# Patient Record
Sex: Male | Born: 1965
Health system: Southern US, Community
[De-identification: ages and names within clinical notes are randomized; demographics above are authoritative.]

---

## 2004-03-25 ENCOUNTER — Encounter (INDEPENDENT_AMBULATORY_CARE_PROVIDER_SITE_OTHER): Payer: Self-pay | Admitting: *Deleted

## 2004-03-25 ENCOUNTER — Ambulatory Visit (HOSPITAL_COMMUNITY): Admission: RE | Admit: 2004-03-25 | Discharge: 2004-03-25 | Payer: Self-pay | Admitting: General Surgery

## 2004-08-27 ENCOUNTER — Emergency Department (HOSPITAL_COMMUNITY): Admission: EM | Admit: 2004-08-27 | Discharge: 2004-08-27 | Payer: Self-pay | Admitting: Emergency Medicine

## 2004-09-17 ENCOUNTER — Emergency Department (HOSPITAL_COMMUNITY): Admission: EM | Admit: 2004-09-17 | Discharge: 2004-09-17 | Payer: Self-pay | Admitting: Emergency Medicine

## 2004-11-08 ENCOUNTER — Ambulatory Visit: Payer: Self-pay | Admitting: Internal Medicine

## 2009-11-23 ENCOUNTER — Ambulatory Visit (HOSPITAL_COMMUNITY): Admission: RE | Admit: 2009-11-23 | Discharge: 2009-11-23 | Payer: Self-pay | Admitting: Family Medicine

## 2011-05-12 NOTE — Op Note (Signed)
NAME:  Joshua Pearson, Joshua Pearson                           ACCOUNT NO.:  [id number]   MEDICAL RECORD NO.:  [id number]                   PATIENT TYPE:  AMB   LOCATION:  DAY                                  FACILITY:  [facility]   PHYSICIAN:  Timothy E. Earlene Plater, M.D.              DATE OF BIRTH:  Jul 07, 1966   DATE OF PROCEDURE:  03/26/2003  DATE OF DISCHARGE:                                 OPERATIVE REPORT   PREOPERATIVE DIAGNOSIS:  Persistent fistula-in-ano.   POSTOPERATIVE DIAGNOSIS:  Persistent fistula-in-ano.   PROCEDURE:  Second-stage fistulotomy.   SURGEON:  Kendrick Ranch, M.D.   ANESTHESIA:  General.   INDICATIONS FOR PROCEDURE:  Mr. Vanetten is 73, recently moved to [place]  from [place].  He had two operations in [place] that are thought to be an  incision and drainage of perirectal abscess, followed by fistulotomy.  I  have followed the patient postoperatively for the past 4 months, and he has  developed what appears to be a recurrent abscess versus fistula with a  nonhealing posterior wound.  He is ready to proceed with reexamination under  anesthesia and fistulotomy as indicated.  This has been carefully discussed  including the precise details of the anatomy of the anorectum including the  sphincter.   The patient was seen in the preop area, evaluated by anesthesia, identified  and the permit signed.   He was taken to the operating room, placed supine, LMA anesthesia provided.  He was placed in lithotomy.  Perianal area inspected, prepped, and draped in  the usual fashion.  The anal orifice and external skin was completely  normal.  The rectal vault was prepped with Betadine and then under vision  with the anoscope in place, the posterior aspect of the anoderm had a 1.5 cm  roughly circular area of granulation tissue.  A malleable probe was dropped  into this area and  quickly found its depth to be approximately 2 cm,  pointing towards the posterior perianal skin.  So, I debrided the  granulation tissue from the cavity, cauterized the edges, cauterized the  bleeding areas.  I opened the distal perianal skin for a distance of 1 cm to  create what hopefully will be external drainage but without interfering with  the sphincters.  I debrided the cavity with bone scoops until clean and  clear.  The edges were cauterized.  Bleeding was controlled, and a pack of  Gelfoam gauze was placed in the cavity.  This completed the procedure.  I  believe the sphincters are intact.  He tolerated it well, counts correct.  He was taken to the recovery room in good condition.   Written and verbal instructions were given him and his wife including Cipro  #42, 1 b.i.d. 500 mg and #30 Percocet 5 mg, and he will be seen and followed  in the office.  Timothy E. Earlene Plater, M.D.    TED/MEDQ  D:  03/25/2004  T:  03/25/2004  Job:  [id number]

## 2011-09-26 ENCOUNTER — Other Ambulatory Visit: Payer: Self-pay | Admitting: Dermatology

## 2012-01-30 ENCOUNTER — Other Ambulatory Visit: Payer: Self-pay | Admitting: Otolaryngology

## 2012-01-30 DIAGNOSIS — H7092 Unspecified mastoiditis, left ear: Secondary | ICD-10-CM

## 2012-02-02 ENCOUNTER — Ambulatory Visit
Admission: RE | Admit: 2012-02-02 | Discharge: 2012-02-02 | Disposition: A | Discharge status: Home / Self Care | Payer: Federal, State, Local not specified - PPO | Source: Ambulatory Visit | Attending: Otolaryngology | Admitting: Otolaryngology

## 2012-02-02 DIAGNOSIS — H7092 Unspecified mastoiditis, left ear: Secondary | ICD-10-CM

## 2012-02-02 MED ORDER — IOHEXOL 300 MG/ML  SOLN
75.0000 mL | Freq: Once | INTRAMUSCULAR | Status: AC | PRN
  Administered 2012-02-02: 75 mL via INTRAVENOUS

## 2015-09-15 ENCOUNTER — Ambulatory Visit (INDEPENDENT_AMBULATORY_CARE_PROVIDER_SITE_OTHER): Payer: Federal, State, Local not specified - PPO | Admitting: Licensed Clinical Social Worker

## 2015-09-15 DIAGNOSIS — F4323 Adjustment disorder with mixed anxiety and depressed mood: Secondary | ICD-10-CM | POA: Diagnosis not present

## 2015-09-30 ENCOUNTER — Ambulatory Visit (INDEPENDENT_AMBULATORY_CARE_PROVIDER_SITE_OTHER): Payer: Federal, State, Local not specified - PPO | Admitting: Licensed Clinical Social Worker

## 2015-09-30 DIAGNOSIS — F4323 Adjustment disorder with mixed anxiety and depressed mood: Secondary | ICD-10-CM | POA: Diagnosis not present

## 2016-04-06 DIAGNOSIS — F4323 Adjustment disorder with mixed anxiety and depressed mood: Secondary | ICD-10-CM | POA: Diagnosis not present

## 2016-04-13 DIAGNOSIS — K080 Exfoliation of teeth due to systemic causes: Secondary | ICD-10-CM | POA: Diagnosis not present

## 2016-04-18 DIAGNOSIS — G4733 Obstructive sleep apnea (adult) (pediatric): Secondary | ICD-10-CM | POA: Diagnosis not present

## 2016-05-12 DIAGNOSIS — Z125 Encounter for screening for malignant neoplasm of prostate: Secondary | ICD-10-CM | POA: Diagnosis not present

## 2016-05-12 DIAGNOSIS — E291 Testicular hypofunction: Secondary | ICD-10-CM | POA: Diagnosis not present

## 2016-06-08 DIAGNOSIS — H01002 Unspecified blepharitis right lower eyelid: Secondary | ICD-10-CM | POA: Diagnosis not present

## 2016-06-08 DIAGNOSIS — H01004 Unspecified blepharitis left upper eyelid: Secondary | ICD-10-CM | POA: Diagnosis not present

## 2016-06-08 DIAGNOSIS — H01001 Unspecified blepharitis right upper eyelid: Secondary | ICD-10-CM | POA: Diagnosis not present

## 2016-07-05 DIAGNOSIS — E039 Hypothyroidism, unspecified: Secondary | ICD-10-CM | POA: Diagnosis not present

## 2016-07-05 DIAGNOSIS — R7309 Other abnormal glucose: Secondary | ICD-10-CM | POA: Diagnosis not present

## 2016-07-05 DIAGNOSIS — G4733 Obstructive sleep apnea (adult) (pediatric): Secondary | ICD-10-CM | POA: Diagnosis not present

## 2016-07-05 DIAGNOSIS — E291 Testicular hypofunction: Secondary | ICD-10-CM | POA: Diagnosis not present

## 2016-07-11 DIAGNOSIS — M25511 Pain in right shoulder: Secondary | ICD-10-CM | POA: Diagnosis not present

## 2016-07-20 DIAGNOSIS — G4733 Obstructive sleep apnea (adult) (pediatric): Secondary | ICD-10-CM | POA: Diagnosis not present

## 2016-07-21 DIAGNOSIS — F4323 Adjustment disorder with mixed anxiety and depressed mood: Secondary | ICD-10-CM | POA: Diagnosis not present

## 2016-07-21 DIAGNOSIS — M4722 Other spondylosis with radiculopathy, cervical region: Secondary | ICD-10-CM | POA: Diagnosis not present

## 2016-07-27 DIAGNOSIS — M546 Pain in thoracic spine: Secondary | ICD-10-CM | POA: Diagnosis not present

## 2016-07-27 DIAGNOSIS — M542 Cervicalgia: Secondary | ICD-10-CM | POA: Diagnosis not present

## 2016-07-27 DIAGNOSIS — M62838 Other muscle spasm: Secondary | ICD-10-CM | POA: Diagnosis not present

## 2016-07-28 DIAGNOSIS — M546 Pain in thoracic spine: Secondary | ICD-10-CM | POA: Diagnosis not present

## 2016-07-28 DIAGNOSIS — M62838 Other muscle spasm: Secondary | ICD-10-CM | POA: Diagnosis not present

## 2016-07-28 DIAGNOSIS — M542 Cervicalgia: Secondary | ICD-10-CM | POA: Diagnosis not present

## 2016-07-30 DIAGNOSIS — M25511 Pain in right shoulder: Secondary | ICD-10-CM | POA: Diagnosis not present

## 2016-07-30 DIAGNOSIS — M549 Dorsalgia, unspecified: Secondary | ICD-10-CM | POA: Diagnosis not present

## 2016-08-09 ENCOUNTER — Encounter: Payer: Self-pay | Admitting: Sports Medicine

## 2016-08-09 ENCOUNTER — Ambulatory Visit (INDEPENDENT_AMBULATORY_CARE_PROVIDER_SITE_OTHER): Payer: Federal, State, Local not specified - PPO | Admitting: Sports Medicine

## 2016-08-09 VITALS — BP 153/112 | Ht 73.0 in | Wt 290.0 lb

## 2016-08-09 DIAGNOSIS — M898X1 Other specified disorders of bone, shoulder: Secondary | ICD-10-CM

## 2016-08-09 DIAGNOSIS — R29898 Other symptoms and signs involving the musculoskeletal system: Secondary | ICD-10-CM

## 2016-08-09 DIAGNOSIS — M542 Cervicalgia: Secondary | ICD-10-CM | POA: Diagnosis not present

## 2016-08-09 MED ORDER — GABAPENTIN 300 MG PO CAPS: ORAL_CAPSULE | ORAL | 1 refills | Status: DC

## 2016-08-09 NOTE — Patient Instructions (Signed)
Thank you for coming in,   We will call with the results of the MRI once it is performed.   Please take gabapentin 300 mg at night for the first week.   If it does not make you too tired, then you can take 300 mg three times a day.     Please feel free to call with any questions or concerns at any time, at [phone]. --Dr. Raeford Razor

## 2016-08-09 NOTE — Progress Notes (Signed)
URIAS CAIN - 50 y.o. male MRN [id number]  Date of birth: 06-11-1966  SUBJECTIVE:  Including CC & ROS.  Chief Complaint  Patient presents with  . Shoulder Pain    Mr. Samra is a 50 year old male that is presenting with right scapular pain and numbness in his posterior right arm. He reports his scapular pain has been there for 20 years and acute on chronic. It first started after he was serving in tenderness. He has since quit playing tennis and has done more weight lifting. He has seen several doctors since his current flare initiated. He went to his primary care doctor and was placed on a muscle relaxer hydrocodone. There is rather then referred to Dr. Tamera Punt who did x-rays of his neck and told him that he had arthritis of his neck. He was placed on prednisone at that time. He also was referred to physical therapy and participated in 2 sessions and reports that the pain became worse. He is seen by an urgent care and placed on tramadol and that seems to improve some of his pain. His also received an IM Depo-Medrol injection but helped some of his pain is well. He reports the pain is sharp. He reports that the numbness and tingling occurs in his C6-C7 dermatome and stops at his distal forearm.  ROS: No unexpected weight loss, fever, chills, swelling, instability, redness, itching, seizure, unsteady gait otherwise see HPI    HISTORY: Past Medical, Surgical, Social, and Family History Reviewed & Updated per EMR.   Pertinent Historical Findings include: PMSHx -  AVM, hypothyroidism, GERD, Brain surgery, anal fistula  PSHx -  No tobacco or alcohol  FHx -  No arthritis that runs in the family, works as a Music therapist   Medications - tramadol, ibuprofen, tylenol, keppra   DATA REVIEWED: X-rays of neck taken at orthopaedic office   PHYSICAL EXAM:  VS: BP:(!) 153/112  HR: bpm  TEMP: ( )  RESP:   HT:6\' 1"  (185.4 cm)   WT:290 lb (131.5 kg)  BMI:38.3 PHYSICAL EXAM: Gen: appears in to be  in pain, alert, cooperative with exam,  HEENT: clear conjunctiva, EOMI CV:  no edema, capillary refill brisk,  Resp: non-labored, normal speech Skin: no rashes, normal turgor  Neuro: normal sensation in hands and fingers.   Psych:  alert and oriented Neck: Inspection unremarkable. No palpable stepoffs. Negative Spurling's maneuver. Limited range of motion in neck flexion to about 5 and reproduction of pain. Normal left rotation and rotation to the right with reproduction of pain.  Limitation with right lateral rotation Normal pincer grasp on left and weak on right  Weakness with resistance to wrist extension on right compared to left  Shoulder: Holding his right shoulder at a more elevated position than left.  Normal scapular function  Palpation is normal with no tenderness over AC joint or bicipital groove. ROM is full in all planes. Weakness with the empty can test  No signs of impingement with negative Hawkin's tests No painful arc and no drop arm sign.   ASSESSMENT & PLAN:   Weakness of right upper extremity The pain, numbness and weakness on exam are most likely originating from his neck. His shoulder exam did not suggest a rotator cuff origin of his pain. - MRI of his cervical neck. - Started gabapentin 300 daily at bedtime and then it advanced to 3 times a day. Counseled on its use. - We will call with the results from the MRI. Depending on the  results we may need to refer to neurosurgery.  Periscapular pain The pain could be related to the innervation of the long thoracic that is C5-C7 that is causing his pain from a pinched nerve in his neck. He has normal scapular function. - MRI cervical neck and gabapentin  CC: Dr. Shirline Frees

## 2016-08-09 NOTE — Assessment & Plan Note (Addendum)
The pain could be related to the innervation of the long thoracic that is C5-C7 that is causing his pain from a pinched nerve in his neck. He has normal scapular function. - MRI cervical neck and gabapentin

## 2016-08-09 NOTE — Assessment & Plan Note (Addendum)
The pain, numbness and weakness on exam are most likely originating from his neck. His shoulder exam did not suggest a rotator cuff origin of his pain. - MRI of his cervical neck. - Started gabapentin 300 daily at bedtime and then it advanced to 3 times a day. Counseled on its use. - We will call with the results from the MRI. Depending on the results we may need to refer to neurosurgery.

## 2016-08-10 ENCOUNTER — Other Ambulatory Visit: Payer: Self-pay | Admitting: *Deleted

## 2016-08-10 ENCOUNTER — Ambulatory Visit
Admission: RE | Admit: 2016-08-10 | Discharge: 2016-08-10 | Disposition: A | Discharge status: Home / Self Care | Payer: Federal, State, Local not specified - PPO | Source: Ambulatory Visit | Attending: Sports Medicine | Admitting: Sports Medicine

## 2016-08-10 DIAGNOSIS — M50221 Other cervical disc displacement at C4-C5 level: Secondary | ICD-10-CM | POA: Diagnosis not present

## 2016-08-10 DIAGNOSIS — M542 Cervicalgia: Secondary | ICD-10-CM

## 2016-08-10 MED ORDER — TRAMADOL HCL 50 MG PO TABS: 50.0000 mg | ORAL_TABLET | Freq: Four times a day (QID) | ORAL | 2 refills | Status: DC | PRN

## 2016-08-11 ENCOUNTER — Telehealth: Payer: Self-pay | Admitting: Family Medicine

## 2016-08-11 NOTE — Telephone Encounter (Signed)
Spoke with patient about his MRI results. It shows a large disc herniation and is most likely the reason for his symptoms. Will refer him to neurosurgery and continue current medications.   Rosemarie Ax, MD PGY-4, [facility] 08/11/2016, 12:26 PM

## 2016-08-14 ENCOUNTER — Telehealth: Payer: Self-pay | Admitting: *Deleted

## 2016-08-14 ENCOUNTER — Other Ambulatory Visit: Payer: Self-pay | Admitting: *Deleted

## 2016-08-14 DIAGNOSIS — F4323 Adjustment disorder with mixed anxiety and depressed mood: Secondary | ICD-10-CM | POA: Diagnosis not present

## 2016-08-14 DIAGNOSIS — M542 Cervicalgia: Secondary | ICD-10-CM

## 2016-08-14 NOTE — Telephone Encounter (Signed)
Spoke to patient about pain and Dr. Oneida Alar suggested to increase his tramadol to twice a day every 4-6 hrs as needed and to increase the gabapentin to 3 times a day. Notes were faxed on Friday to [facility].  Patient will call there to see when appt can be made asap.

## 2016-08-14 NOTE — Telephone Encounter (Signed)
-----   Message from Carolyne Littles sent at 08/14/2016  9:23 AM EDT ----- Regarding: phone message Contact: [phone] Pt called stating pain is really bad still, cant sleep at night. Please call to discuss.

## 2016-08-16 ENCOUNTER — Ambulatory Visit: Payer: Self-pay | Admitting: Sports Medicine

## 2016-08-17 DIAGNOSIS — Z6838 Body mass index (BMI) 38.0-38.9, adult: Secondary | ICD-10-CM | POA: Diagnosis not present

## 2016-08-17 DIAGNOSIS — M542 Cervicalgia: Secondary | ICD-10-CM | POA: Diagnosis not present

## 2016-08-17 DIAGNOSIS — M5412 Radiculopathy, cervical region: Secondary | ICD-10-CM | POA: Diagnosis not present

## 2016-08-17 DIAGNOSIS — M4802 Spinal stenosis, cervical region: Secondary | ICD-10-CM | POA: Diagnosis not present

## 2016-08-20 DIAGNOSIS — G4733 Obstructive sleep apnea (adult) (pediatric): Secondary | ICD-10-CM | POA: Diagnosis not present

## 2016-08-24 DIAGNOSIS — M4722 Other spondylosis with radiculopathy, cervical region: Secondary | ICD-10-CM | POA: Diagnosis not present

## 2016-08-24 DIAGNOSIS — M5020 Other cervical disc displacement, unspecified cervical region: Secondary | ICD-10-CM | POA: Diagnosis not present

## 2016-08-24 DIAGNOSIS — M50323 Other cervical disc degeneration at C6-C7 level: Secondary | ICD-10-CM | POA: Diagnosis not present

## 2016-08-24 DIAGNOSIS — M50123 Cervical disc disorder at C6-C7 level with radiculopathy: Secondary | ICD-10-CM | POA: Diagnosis not present

## 2016-09-13 DIAGNOSIS — G4733 Obstructive sleep apnea (adult) (pediatric): Secondary | ICD-10-CM | POA: Diagnosis not present

## 2016-09-18 DIAGNOSIS — M5000 Cervical disc disorder with myelopathy, unspecified cervical region: Secondary | ICD-10-CM | POA: Diagnosis not present

## 2016-09-20 DIAGNOSIS — G4733 Obstructive sleep apnea (adult) (pediatric): Secondary | ICD-10-CM | POA: Diagnosis not present

## 2016-09-25 DIAGNOSIS — F4323 Adjustment disorder with mixed anxiety and depressed mood: Secondary | ICD-10-CM | POA: Diagnosis not present

## 2016-10-10 DIAGNOSIS — F4323 Adjustment disorder with mixed anxiety and depressed mood: Secondary | ICD-10-CM | POA: Diagnosis not present

## 2016-10-20 DIAGNOSIS — G4733 Obstructive sleep apnea (adult) (pediatric): Secondary | ICD-10-CM | POA: Diagnosis not present

## 2016-10-23 DIAGNOSIS — F4323 Adjustment disorder with mixed anxiety and depressed mood: Secondary | ICD-10-CM | POA: Diagnosis not present

## 2016-10-30 DIAGNOSIS — M5412 Radiculopathy, cervical region: Secondary | ICD-10-CM | POA: Diagnosis not present

## 2016-11-03 DIAGNOSIS — F4323 Adjustment disorder with mixed anxiety and depressed mood: Secondary | ICD-10-CM | POA: Diagnosis not present

## 2016-11-10 DIAGNOSIS — F4323 Adjustment disorder with mixed anxiety and depressed mood: Secondary | ICD-10-CM | POA: Diagnosis not present

## 2016-11-20 DIAGNOSIS — G40209 Localization-related (focal) (partial) symptomatic epilepsy and epileptic syndromes with complex partial seizures, not intractable, without status epilepticus: Secondary | ICD-10-CM | POA: Diagnosis not present

## 2016-11-20 DIAGNOSIS — G4733 Obstructive sleep apnea (adult) (pediatric): Secondary | ICD-10-CM | POA: Diagnosis not present

## 2016-11-20 DIAGNOSIS — R419 Unspecified symptoms and signs involving cognitive functions and awareness: Secondary | ICD-10-CM | POA: Diagnosis not present

## 2016-12-04 DIAGNOSIS — E039 Hypothyroidism, unspecified: Secondary | ICD-10-CM | POA: Diagnosis not present

## 2016-12-04 DIAGNOSIS — F419 Anxiety disorder, unspecified: Secondary | ICD-10-CM | POA: Diagnosis not present

## 2016-12-04 DIAGNOSIS — E782 Mixed hyperlipidemia: Secondary | ICD-10-CM | POA: Diagnosis not present

## 2016-12-04 DIAGNOSIS — E291 Testicular hypofunction: Secondary | ICD-10-CM | POA: Diagnosis not present

## 2016-12-04 DIAGNOSIS — R7301 Impaired fasting glucose: Secondary | ICD-10-CM | POA: Diagnosis not present

## 2016-12-06 DIAGNOSIS — F4323 Adjustment disorder with mixed anxiety and depressed mood: Secondary | ICD-10-CM | POA: Diagnosis not present

## 2016-12-20 DIAGNOSIS — G4733 Obstructive sleep apnea (adult) (pediatric): Secondary | ICD-10-CM | POA: Diagnosis not present

## 2016-12-22 DIAGNOSIS — F4323 Adjustment disorder with mixed anxiety and depressed mood: Secondary | ICD-10-CM | POA: Diagnosis not present

## 2016-12-25 DIAGNOSIS — G4733 Obstructive sleep apnea (adult) (pediatric): Secondary | ICD-10-CM | POA: Diagnosis not present

## 2016-12-27 DIAGNOSIS — F4323 Adjustment disorder with mixed anxiety and depressed mood: Secondary | ICD-10-CM | POA: Diagnosis not present

## 2017-01-12 DIAGNOSIS — F4323 Adjustment disorder with mixed anxiety and depressed mood: Secondary | ICD-10-CM | POA: Diagnosis not present

## 2017-01-16 DIAGNOSIS — F4323 Adjustment disorder with mixed anxiety and depressed mood: Secondary | ICD-10-CM | POA: Diagnosis not present

## 2017-01-19 DIAGNOSIS — M5000 Cervical disc disorder with myelopathy, unspecified cervical region: Secondary | ICD-10-CM | POA: Diagnosis not present

## 2017-01-20 DIAGNOSIS — G4733 Obstructive sleep apnea (adult) (pediatric): Secondary | ICD-10-CM | POA: Diagnosis not present

## 2017-02-06 DIAGNOSIS — M542 Cervicalgia: Secondary | ICD-10-CM | POA: Diagnosis not present

## 2017-02-12 DIAGNOSIS — F4323 Adjustment disorder with mixed anxiety and depressed mood: Secondary | ICD-10-CM | POA: Diagnosis not present

## 2017-02-20 DIAGNOSIS — G4733 Obstructive sleep apnea (adult) (pediatric): Secondary | ICD-10-CM | POA: Diagnosis not present

## 2017-02-26 DIAGNOSIS — M542 Cervicalgia: Secondary | ICD-10-CM | POA: Diagnosis not present

## 2017-02-26 DIAGNOSIS — M546 Pain in thoracic spine: Secondary | ICD-10-CM | POA: Diagnosis not present

## 2017-02-26 DIAGNOSIS — M62838 Other muscle spasm: Secondary | ICD-10-CM | POA: Diagnosis not present

## 2017-03-06 DIAGNOSIS — M546 Pain in thoracic spine: Secondary | ICD-10-CM | POA: Diagnosis not present

## 2017-03-06 DIAGNOSIS — M62838 Other muscle spasm: Secondary | ICD-10-CM | POA: Diagnosis not present

## 2017-03-06 DIAGNOSIS — M542 Cervicalgia: Secondary | ICD-10-CM | POA: Diagnosis not present

## 2017-03-15 DIAGNOSIS — M546 Pain in thoracic spine: Secondary | ICD-10-CM | POA: Diagnosis not present

## 2017-03-15 DIAGNOSIS — M62838 Other muscle spasm: Secondary | ICD-10-CM | POA: Diagnosis not present

## 2017-03-15 DIAGNOSIS — M542 Cervicalgia: Secondary | ICD-10-CM | POA: Diagnosis not present

## 2017-03-20 DIAGNOSIS — G4733 Obstructive sleep apnea (adult) (pediatric): Secondary | ICD-10-CM | POA: Diagnosis not present

## 2017-03-26 DIAGNOSIS — M546 Pain in thoracic spine: Secondary | ICD-10-CM | POA: Diagnosis not present

## 2017-03-26 DIAGNOSIS — M62838 Other muscle spasm: Secondary | ICD-10-CM | POA: Diagnosis not present

## 2017-03-26 DIAGNOSIS — M542 Cervicalgia: Secondary | ICD-10-CM | POA: Diagnosis not present

## 2017-04-02 DIAGNOSIS — J029 Acute pharyngitis, unspecified: Secondary | ICD-10-CM | POA: Diagnosis not present

## 2017-04-02 DIAGNOSIS — Z20828 Contact with and (suspected) exposure to other viral communicable diseases: Secondary | ICD-10-CM | POA: Diagnosis not present

## 2017-04-04 DIAGNOSIS — M62838 Other muscle spasm: Secondary | ICD-10-CM | POA: Diagnosis not present

## 2017-04-04 DIAGNOSIS — M542 Cervicalgia: Secondary | ICD-10-CM | POA: Diagnosis not present

## 2017-04-04 DIAGNOSIS — M546 Pain in thoracic spine: Secondary | ICD-10-CM | POA: Diagnosis not present

## 2017-04-06 DIAGNOSIS — F4323 Adjustment disorder with mixed anxiety and depressed mood: Secondary | ICD-10-CM | POA: Diagnosis not present

## 2017-04-11 DIAGNOSIS — M62838 Other muscle spasm: Secondary | ICD-10-CM | POA: Diagnosis not present

## 2017-04-11 DIAGNOSIS — M542 Cervicalgia: Secondary | ICD-10-CM | POA: Diagnosis not present

## 2017-04-11 DIAGNOSIS — M546 Pain in thoracic spine: Secondary | ICD-10-CM | POA: Diagnosis not present

## 2017-04-16 DIAGNOSIS — K080 Exfoliation of teeth due to systemic causes: Secondary | ICD-10-CM | POA: Diagnosis not present

## 2017-04-19 DIAGNOSIS — M546 Pain in thoracic spine: Secondary | ICD-10-CM | POA: Diagnosis not present

## 2017-04-19 DIAGNOSIS — M62838 Other muscle spasm: Secondary | ICD-10-CM | POA: Diagnosis not present

## 2017-04-19 DIAGNOSIS — M542 Cervicalgia: Secondary | ICD-10-CM | POA: Diagnosis not present

## 2017-04-20 DIAGNOSIS — G4733 Obstructive sleep apnea (adult) (pediatric): Secondary | ICD-10-CM | POA: Diagnosis not present

## 2017-05-20 DIAGNOSIS — G4733 Obstructive sleep apnea (adult) (pediatric): Secondary | ICD-10-CM | POA: Diagnosis not present

## 2017-05-24 DIAGNOSIS — F411 Generalized anxiety disorder: Secondary | ICD-10-CM | POA: Diagnosis not present

## 2017-05-24 DIAGNOSIS — Z630 Problems in relationship with spouse or partner: Secondary | ICD-10-CM | POA: Diagnosis not present

## 2017-05-31 DIAGNOSIS — J208 Acute bronchitis due to other specified organisms: Secondary | ICD-10-CM | POA: Diagnosis not present

## 2017-05-31 DIAGNOSIS — R05 Cough: Secondary | ICD-10-CM | POA: Diagnosis not present

## 2017-06-05 DIAGNOSIS — F411 Generalized anxiety disorder: Secondary | ICD-10-CM | POA: Diagnosis not present

## 2017-06-05 DIAGNOSIS — Z630 Problems in relationship with spouse or partner: Secondary | ICD-10-CM | POA: Diagnosis not present

## 2017-06-14 DIAGNOSIS — F411 Generalized anxiety disorder: Secondary | ICD-10-CM | POA: Diagnosis not present

## 2017-06-14 DIAGNOSIS — Z630 Problems in relationship with spouse or partner: Secondary | ICD-10-CM | POA: Diagnosis not present

## 2017-06-18 DIAGNOSIS — Z630 Problems in relationship with spouse or partner: Secondary | ICD-10-CM | POA: Diagnosis not present

## 2017-06-18 DIAGNOSIS — F411 Generalized anxiety disorder: Secondary | ICD-10-CM | POA: Diagnosis not present

## 2017-06-20 DIAGNOSIS — M4802 Spinal stenosis, cervical region: Secondary | ICD-10-CM | POA: Diagnosis not present

## 2017-06-20 DIAGNOSIS — M542 Cervicalgia: Secondary | ICD-10-CM | POA: Diagnosis not present

## 2017-06-20 DIAGNOSIS — M546 Pain in thoracic spine: Secondary | ICD-10-CM | POA: Diagnosis not present

## 2017-06-20 DIAGNOSIS — M5000 Cervical disc disorder with myelopathy, unspecified cervical region: Secondary | ICD-10-CM | POA: Diagnosis not present

## 2017-06-20 DIAGNOSIS — G4733 Obstructive sleep apnea (adult) (pediatric): Secondary | ICD-10-CM | POA: Diagnosis not present

## 2017-07-03 ENCOUNTER — Other Ambulatory Visit: Payer: Self-pay | Admitting: Neurosurgery

## 2017-07-03 DIAGNOSIS — M5414 Radiculopathy, thoracic region: Secondary | ICD-10-CM

## 2017-07-03 DIAGNOSIS — M5000 Cervical disc disorder with myelopathy, unspecified cervical region: Secondary | ICD-10-CM

## 2017-07-06 DIAGNOSIS — Z630 Problems in relationship with spouse or partner: Secondary | ICD-10-CM | POA: Diagnosis not present

## 2017-07-06 DIAGNOSIS — F411 Generalized anxiety disorder: Secondary | ICD-10-CM | POA: Diagnosis not present

## 2017-07-16 DIAGNOSIS — Z630 Problems in relationship with spouse or partner: Secondary | ICD-10-CM | POA: Diagnosis not present

## 2017-07-16 DIAGNOSIS — F411 Generalized anxiety disorder: Secondary | ICD-10-CM | POA: Diagnosis not present

## 2017-07-19 ENCOUNTER — Ambulatory Visit
Admission: RE | Admit: 2017-07-19 | Discharge: 2017-07-19 | Disposition: A | Discharge status: Home / Self Care | Payer: Federal, State, Local not specified - PPO | Source: Ambulatory Visit | Attending: Neurosurgery | Admitting: Neurosurgery

## 2017-07-19 DIAGNOSIS — M5124 Other intervertebral disc displacement, thoracic region: Secondary | ICD-10-CM | POA: Diagnosis not present

## 2017-07-19 DIAGNOSIS — M5000 Cervical disc disorder with myelopathy, unspecified cervical region: Secondary | ICD-10-CM

## 2017-07-19 DIAGNOSIS — M50222 Other cervical disc displacement at C5-C6 level: Secondary | ICD-10-CM | POA: Diagnosis not present

## 2017-07-19 DIAGNOSIS — M5414 Radiculopathy, thoracic region: Secondary | ICD-10-CM

## 2017-07-20 DIAGNOSIS — R7303 Prediabetes: Secondary | ICD-10-CM | POA: Diagnosis not present

## 2017-07-20 DIAGNOSIS — E782 Mixed hyperlipidemia: Secondary | ICD-10-CM | POA: Diagnosis not present

## 2017-07-20 DIAGNOSIS — Z125 Encounter for screening for malignant neoplasm of prostate: Secondary | ICD-10-CM | POA: Diagnosis not present

## 2017-07-20 DIAGNOSIS — E291 Testicular hypofunction: Secondary | ICD-10-CM | POA: Diagnosis not present

## 2017-07-20 DIAGNOSIS — Z1211 Encounter for screening for malignant neoplasm of colon: Secondary | ICD-10-CM | POA: Diagnosis not present

## 2017-07-20 DIAGNOSIS — G4733 Obstructive sleep apnea (adult) (pediatric): Secondary | ICD-10-CM | POA: Diagnosis not present

## 2017-07-20 DIAGNOSIS — E559 Vitamin D deficiency, unspecified: Secondary | ICD-10-CM | POA: Diagnosis not present

## 2017-07-23 DIAGNOSIS — F411 Generalized anxiety disorder: Secondary | ICD-10-CM | POA: Diagnosis not present

## 2017-07-30 DIAGNOSIS — Z630 Problems in relationship with spouse or partner: Secondary | ICD-10-CM | POA: Diagnosis not present

## 2017-07-30 DIAGNOSIS — F411 Generalized anxiety disorder: Secondary | ICD-10-CM | POA: Diagnosis not present

## 2017-07-31 DIAGNOSIS — K080 Exfoliation of teeth due to systemic causes: Secondary | ICD-10-CM | POA: Diagnosis not present

## 2017-08-01 DIAGNOSIS — Z6839 Body mass index (BMI) 39.0-39.9, adult: Secondary | ICD-10-CM | POA: Diagnosis not present

## 2017-08-01 DIAGNOSIS — M542 Cervicalgia: Secondary | ICD-10-CM | POA: Diagnosis not present

## 2017-08-01 DIAGNOSIS — R030 Elevated blood-pressure reading, without diagnosis of hypertension: Secondary | ICD-10-CM | POA: Diagnosis not present

## 2017-08-01 DIAGNOSIS — M546 Pain in thoracic spine: Secondary | ICD-10-CM | POA: Diagnosis not present

## 2017-08-06 DIAGNOSIS — F411 Generalized anxiety disorder: Secondary | ICD-10-CM | POA: Diagnosis not present

## 2017-08-06 DIAGNOSIS — Z630 Problems in relationship with spouse or partner: Secondary | ICD-10-CM | POA: Diagnosis not present

## 2017-08-13 DIAGNOSIS — Z630 Problems in relationship with spouse or partner: Secondary | ICD-10-CM | POA: Diagnosis not present

## 2017-08-13 DIAGNOSIS — F411 Generalized anxiety disorder: Secondary | ICD-10-CM | POA: Diagnosis not present

## 2017-08-20 DIAGNOSIS — F411 Generalized anxiety disorder: Secondary | ICD-10-CM | POA: Diagnosis not present

## 2017-08-20 DIAGNOSIS — Z630 Problems in relationship with spouse or partner: Secondary | ICD-10-CM | POA: Diagnosis not present

## 2017-08-30 DIAGNOSIS — F4323 Adjustment disorder with mixed anxiety and depressed mood: Secondary | ICD-10-CM | POA: Diagnosis not present

## 2017-09-11 DIAGNOSIS — M546 Pain in thoracic spine: Secondary | ICD-10-CM | POA: Diagnosis not present

## 2017-09-11 DIAGNOSIS — M545 Low back pain: Secondary | ICD-10-CM | POA: Diagnosis not present

## 2017-09-11 DIAGNOSIS — E291 Testicular hypofunction: Secondary | ICD-10-CM | POA: Diagnosis not present

## 2017-09-12 DIAGNOSIS — F411 Generalized anxiety disorder: Secondary | ICD-10-CM | POA: Diagnosis not present

## 2017-09-12 DIAGNOSIS — Z630 Problems in relationship with spouse or partner: Secondary | ICD-10-CM | POA: Diagnosis not present

## 2017-09-20 DIAGNOSIS — M546 Pain in thoracic spine: Secondary | ICD-10-CM | POA: Diagnosis not present

## 2017-09-20 DIAGNOSIS — M545 Low back pain: Secondary | ICD-10-CM | POA: Diagnosis not present

## 2017-09-26 DIAGNOSIS — M546 Pain in thoracic spine: Secondary | ICD-10-CM | POA: Diagnosis not present

## 2017-09-26 DIAGNOSIS — M545 Low back pain: Secondary | ICD-10-CM | POA: Diagnosis not present

## 2017-09-28 DIAGNOSIS — F411 Generalized anxiety disorder: Secondary | ICD-10-CM | POA: Diagnosis not present

## 2017-09-28 DIAGNOSIS — Z630 Problems in relationship with spouse or partner: Secondary | ICD-10-CM | POA: Diagnosis not present

## 2017-10-05 DIAGNOSIS — F411 Generalized anxiety disorder: Secondary | ICD-10-CM | POA: Diagnosis not present

## 2017-10-05 DIAGNOSIS — Z630 Problems in relationship with spouse or partner: Secondary | ICD-10-CM | POA: Diagnosis not present

## 2017-10-08 DIAGNOSIS — M545 Low back pain: Secondary | ICD-10-CM | POA: Diagnosis not present

## 2017-10-08 DIAGNOSIS — M546 Pain in thoracic spine: Secondary | ICD-10-CM | POA: Diagnosis not present

## 2017-10-12 DIAGNOSIS — F411 Generalized anxiety disorder: Secondary | ICD-10-CM | POA: Diagnosis not present

## 2017-10-12 DIAGNOSIS — E291 Testicular hypofunction: Secondary | ICD-10-CM | POA: Diagnosis not present

## 2017-10-17 DIAGNOSIS — F411 Generalized anxiety disorder: Secondary | ICD-10-CM | POA: Diagnosis not present

## 2017-10-22 DIAGNOSIS — F411 Generalized anxiety disorder: Secondary | ICD-10-CM | POA: Diagnosis not present

## 2017-10-22 DIAGNOSIS — K080 Exfoliation of teeth due to systemic causes: Secondary | ICD-10-CM | POA: Diagnosis not present

## 2017-10-23 DIAGNOSIS — M546 Pain in thoracic spine: Secondary | ICD-10-CM | POA: Diagnosis not present

## 2017-10-23 DIAGNOSIS — G40209 Localization-related (focal) (partial) symptomatic epilepsy and epileptic syndromes with complex partial seizures, not intractable, without status epilepticus: Secondary | ICD-10-CM | POA: Diagnosis not present

## 2017-10-23 DIAGNOSIS — Q282 Arteriovenous malformation of cerebral vessels: Secondary | ICD-10-CM | POA: Diagnosis not present

## 2017-10-23 DIAGNOSIS — M545 Low back pain: Secondary | ICD-10-CM | POA: Diagnosis not present

## 2017-10-29 DIAGNOSIS — F411 Generalized anxiety disorder: Secondary | ICD-10-CM | POA: Diagnosis not present

## 2017-10-31 DIAGNOSIS — E669 Obesity, unspecified: Secondary | ICD-10-CM | POA: Diagnosis not present

## 2017-10-31 DIAGNOSIS — E291 Testicular hypofunction: Secondary | ICD-10-CM | POA: Diagnosis not present

## 2017-10-31 DIAGNOSIS — R7303 Prediabetes: Secondary | ICD-10-CM | POA: Diagnosis not present

## 2017-11-01 DIAGNOSIS — M546 Pain in thoracic spine: Secondary | ICD-10-CM | POA: Diagnosis not present

## 2017-11-01 DIAGNOSIS — M545 Low back pain: Secondary | ICD-10-CM | POA: Diagnosis not present

## 2017-11-07 DIAGNOSIS — M546 Pain in thoracic spine: Secondary | ICD-10-CM | POA: Diagnosis not present

## 2017-11-07 DIAGNOSIS — G4733 Obstructive sleep apnea (adult) (pediatric): Secondary | ICD-10-CM | POA: Diagnosis not present

## 2017-11-07 DIAGNOSIS — M545 Low back pain: Secondary | ICD-10-CM | POA: Diagnosis not present

## 2017-11-13 DIAGNOSIS — F411 Generalized anxiety disorder: Secondary | ICD-10-CM | POA: Diagnosis not present

## 2017-11-14 DIAGNOSIS — R3914 Feeling of incomplete bladder emptying: Secondary | ICD-10-CM | POA: Diagnosis not present

## 2017-11-14 DIAGNOSIS — E291 Testicular hypofunction: Secondary | ICD-10-CM | POA: Diagnosis not present

## 2017-11-14 DIAGNOSIS — R350 Frequency of micturition: Secondary | ICD-10-CM | POA: Diagnosis not present

## 2017-11-19 DIAGNOSIS — Z6841 Body Mass Index (BMI) 40.0 and over, adult: Secondary | ICD-10-CM | POA: Diagnosis not present

## 2017-11-19 DIAGNOSIS — R948 Abnormal results of function studies of other organs and systems: Secondary | ICD-10-CM | POA: Diagnosis not present

## 2017-11-20 DIAGNOSIS — M546 Pain in thoracic spine: Secondary | ICD-10-CM | POA: Diagnosis not present

## 2017-11-20 DIAGNOSIS — M545 Low back pain: Secondary | ICD-10-CM | POA: Diagnosis not present

## 2017-11-22 DIAGNOSIS — F411 Generalized anxiety disorder: Secondary | ICD-10-CM | POA: Diagnosis not present

## 2017-11-26 DIAGNOSIS — F411 Generalized anxiety disorder: Secondary | ICD-10-CM | POA: Diagnosis not present

## 2017-11-28 DIAGNOSIS — F411 Generalized anxiety disorder: Secondary | ICD-10-CM | POA: Diagnosis not present

## 2017-11-29 DIAGNOSIS — R7303 Prediabetes: Secondary | ICD-10-CM | POA: Diagnosis not present

## 2017-11-29 DIAGNOSIS — Z6841 Body Mass Index (BMI) 40.0 and over, adult: Secondary | ICD-10-CM | POA: Diagnosis not present

## 2017-11-29 DIAGNOSIS — E8881 Metabolic syndrome: Secondary | ICD-10-CM | POA: Diagnosis not present

## 2017-11-30 DIAGNOSIS — E291 Testicular hypofunction: Secondary | ICD-10-CM | POA: Diagnosis not present

## 2017-11-30 DIAGNOSIS — R3914 Feeling of incomplete bladder emptying: Secondary | ICD-10-CM | POA: Diagnosis not present

## 2017-12-05 DIAGNOSIS — F411 Generalized anxiety disorder: Secondary | ICD-10-CM | POA: Diagnosis not present

## 2017-12-07 DIAGNOSIS — F411 Generalized anxiety disorder: Secondary | ICD-10-CM | POA: Diagnosis not present

## 2017-12-11 DIAGNOSIS — Z713 Dietary counseling and surveillance: Secondary | ICD-10-CM | POA: Diagnosis not present

## 2017-12-12 DIAGNOSIS — F411 Generalized anxiety disorder: Secondary | ICD-10-CM | POA: Diagnosis not present

## 2017-12-13 DIAGNOSIS — F4323 Adjustment disorder with mixed anxiety and depressed mood: Secondary | ICD-10-CM | POA: Diagnosis not present

## 2017-12-14 DIAGNOSIS — F411 Generalized anxiety disorder: Secondary | ICD-10-CM | POA: Diagnosis not present

## 2017-12-14 DIAGNOSIS — E291 Testicular hypofunction: Secondary | ICD-10-CM | POA: Diagnosis not present

## 2017-12-20 DIAGNOSIS — F411 Generalized anxiety disorder: Secondary | ICD-10-CM | POA: Diagnosis not present

## 2017-12-26 DIAGNOSIS — F411 Generalized anxiety disorder: Secondary | ICD-10-CM | POA: Diagnosis not present

## 2017-12-27 DIAGNOSIS — F411 Generalized anxiety disorder: Secondary | ICD-10-CM | POA: Diagnosis not present

## 2017-12-28 DIAGNOSIS — E291 Testicular hypofunction: Secondary | ICD-10-CM | POA: Diagnosis not present

## 2017-12-28 DIAGNOSIS — R7303 Prediabetes: Secondary | ICD-10-CM | POA: Diagnosis not present

## 2017-12-28 DIAGNOSIS — N5201 Erectile dysfunction due to arterial insufficiency: Secondary | ICD-10-CM | POA: Diagnosis not present

## 2017-12-28 DIAGNOSIS — Z6841 Body Mass Index (BMI) 40.0 and over, adult: Secondary | ICD-10-CM | POA: Diagnosis not present

## 2017-12-28 DIAGNOSIS — E786 Lipoprotein deficiency: Secondary | ICD-10-CM | POA: Diagnosis not present

## 2018-01-01 DIAGNOSIS — Z713 Dietary counseling and surveillance: Secondary | ICD-10-CM | POA: Diagnosis not present

## 2018-01-04 DIAGNOSIS — R7989 Other specified abnormal findings of blood chemistry: Secondary | ICD-10-CM | POA: Diagnosis not present

## 2018-01-04 DIAGNOSIS — F411 Generalized anxiety disorder: Secondary | ICD-10-CM | POA: Diagnosis not present

## 2018-01-04 DIAGNOSIS — E559 Vitamin D deficiency, unspecified: Secondary | ICD-10-CM | POA: Diagnosis not present

## 2018-01-04 DIAGNOSIS — E8881 Metabolic syndrome: Secondary | ICD-10-CM | POA: Diagnosis not present

## 2018-01-04 DIAGNOSIS — R7303 Prediabetes: Secondary | ICD-10-CM | POA: Diagnosis not present

## 2018-01-04 DIAGNOSIS — R635 Abnormal weight gain: Secondary | ICD-10-CM | POA: Diagnosis not present

## 2018-01-04 DIAGNOSIS — R5383 Other fatigue: Secondary | ICD-10-CM | POA: Diagnosis not present

## 2018-01-09 DIAGNOSIS — F411 Generalized anxiety disorder: Secondary | ICD-10-CM | POA: Diagnosis not present

## 2018-01-16 DIAGNOSIS — F411 Generalized anxiety disorder: Secondary | ICD-10-CM | POA: Diagnosis not present

## 2018-01-21 DIAGNOSIS — F411 Generalized anxiety disorder: Secondary | ICD-10-CM | POA: Diagnosis not present

## 2018-01-22 DIAGNOSIS — R7303 Prediabetes: Secondary | ICD-10-CM | POA: Diagnosis not present

## 2018-01-22 DIAGNOSIS — F4323 Adjustment disorder with mixed anxiety and depressed mood: Secondary | ICD-10-CM | POA: Diagnosis not present

## 2018-01-22 DIAGNOSIS — E669 Obesity, unspecified: Secondary | ICD-10-CM | POA: Diagnosis not present

## 2018-01-22 DIAGNOSIS — Z6839 Body mass index (BMI) 39.0-39.9, adult: Secondary | ICD-10-CM | POA: Diagnosis not present

## 2018-01-22 DIAGNOSIS — R030 Elevated blood-pressure reading, without diagnosis of hypertension: Secondary | ICD-10-CM | POA: Diagnosis not present

## 2018-01-23 DIAGNOSIS — G4733 Obstructive sleep apnea (adult) (pediatric): Secondary | ICD-10-CM | POA: Diagnosis not present

## 2018-02-06 DIAGNOSIS — F411 Generalized anxiety disorder: Secondary | ICD-10-CM | POA: Diagnosis not present

## 2018-02-12 DIAGNOSIS — F411 Generalized anxiety disorder: Secondary | ICD-10-CM | POA: Diagnosis not present

## 2018-02-18 DIAGNOSIS — F411 Generalized anxiety disorder: Secondary | ICD-10-CM | POA: Diagnosis not present

## 2018-02-27 DIAGNOSIS — F411 Generalized anxiety disorder: Secondary | ICD-10-CM | POA: Diagnosis not present

## 2018-02-27 DIAGNOSIS — E291 Testicular hypofunction: Secondary | ICD-10-CM | POA: Diagnosis not present

## 2018-02-27 DIAGNOSIS — R5383 Other fatigue: Secondary | ICD-10-CM | POA: Diagnosis not present

## 2018-02-27 DIAGNOSIS — E559 Vitamin D deficiency, unspecified: Secondary | ICD-10-CM | POA: Diagnosis not present

## 2018-02-28 DIAGNOSIS — J069 Acute upper respiratory infection, unspecified: Secondary | ICD-10-CM | POA: Diagnosis not present

## 2018-03-05 DIAGNOSIS — F4323 Adjustment disorder with mixed anxiety and depressed mood: Secondary | ICD-10-CM | POA: Diagnosis not present

## 2018-03-05 DIAGNOSIS — F411 Generalized anxiety disorder: Secondary | ICD-10-CM | POA: Diagnosis not present

## 2018-03-06 DIAGNOSIS — F411 Generalized anxiety disorder: Secondary | ICD-10-CM | POA: Diagnosis not present

## 2018-03-08 DIAGNOSIS — R7303 Prediabetes: Secondary | ICD-10-CM | POA: Diagnosis not present

## 2018-03-08 DIAGNOSIS — E559 Vitamin D deficiency, unspecified: Secondary | ICD-10-CM | POA: Diagnosis not present

## 2018-03-08 DIAGNOSIS — K219 Gastro-esophageal reflux disease without esophagitis: Secondary | ICD-10-CM | POA: Diagnosis not present

## 2018-03-08 DIAGNOSIS — F5101 Primary insomnia: Secondary | ICD-10-CM | POA: Diagnosis not present

## 2018-03-14 DIAGNOSIS — F411 Generalized anxiety disorder: Secondary | ICD-10-CM | POA: Diagnosis not present

## 2018-03-15 DIAGNOSIS — G4733 Obstructive sleep apnea (adult) (pediatric): Secondary | ICD-10-CM | POA: Diagnosis not present

## 2018-03-18 DIAGNOSIS — F4320 Adjustment disorder, unspecified: Secondary | ICD-10-CM | POA: Diagnosis not present

## 2018-03-19 DIAGNOSIS — Z713 Dietary counseling and surveillance: Secondary | ICD-10-CM | POA: Diagnosis not present

## 2018-03-19 DIAGNOSIS — F411 Generalized anxiety disorder: Secondary | ICD-10-CM | POA: Diagnosis not present

## 2018-03-19 DIAGNOSIS — E669 Obesity, unspecified: Secondary | ICD-10-CM | POA: Diagnosis not present

## 2018-03-28 DIAGNOSIS — E786 Lipoprotein deficiency: Secondary | ICD-10-CM | POA: Diagnosis not present

## 2018-03-28 DIAGNOSIS — Z6837 Body mass index (BMI) 37.0-37.9, adult: Secondary | ICD-10-CM | POA: Diagnosis not present

## 2018-03-28 DIAGNOSIS — E669 Obesity, unspecified: Secondary | ICD-10-CM | POA: Diagnosis not present

## 2018-03-28 DIAGNOSIS — F411 Generalized anxiety disorder: Secondary | ICD-10-CM | POA: Diagnosis not present

## 2018-03-28 DIAGNOSIS — R7303 Prediabetes: Secondary | ICD-10-CM | POA: Diagnosis not present

## 2018-04-05 DIAGNOSIS — F411 Generalized anxiety disorder: Secondary | ICD-10-CM | POA: Diagnosis not present

## 2018-04-16 DIAGNOSIS — E669 Obesity, unspecified: Secondary | ICD-10-CM | POA: Diagnosis not present

## 2018-04-16 DIAGNOSIS — Z713 Dietary counseling and surveillance: Secondary | ICD-10-CM | POA: Diagnosis not present

## 2018-04-19 DIAGNOSIS — F411 Generalized anxiety disorder: Secondary | ICD-10-CM | POA: Diagnosis not present

## 2018-04-25 DIAGNOSIS — F411 Generalized anxiety disorder: Secondary | ICD-10-CM | POA: Diagnosis not present

## 2018-05-08 DIAGNOSIS — F4323 Adjustment disorder with mixed anxiety and depressed mood: Secondary | ICD-10-CM | POA: Diagnosis not present

## 2018-05-16 DIAGNOSIS — F411 Generalized anxiety disorder: Secondary | ICD-10-CM | POA: Diagnosis not present

## 2018-05-28 DIAGNOSIS — L918 Other hypertrophic disorders of the skin: Secondary | ICD-10-CM | POA: Diagnosis not present

## 2018-05-28 DIAGNOSIS — D2271 Melanocytic nevi of right lower limb, including hip: Secondary | ICD-10-CM | POA: Diagnosis not present

## 2018-05-28 DIAGNOSIS — D2262 Melanocytic nevi of left upper limb, including shoulder: Secondary | ICD-10-CM | POA: Diagnosis not present

## 2018-05-28 DIAGNOSIS — H9313 Tinnitus, bilateral: Secondary | ICD-10-CM | POA: Diagnosis not present

## 2018-05-28 DIAGNOSIS — D2272 Melanocytic nevi of left lower limb, including hip: Secondary | ICD-10-CM | POA: Diagnosis not present

## 2018-05-28 DIAGNOSIS — H6123 Impacted cerumen, bilateral: Secondary | ICD-10-CM | POA: Diagnosis not present

## 2018-05-28 DIAGNOSIS — H903 Sensorineural hearing loss, bilateral: Secondary | ICD-10-CM | POA: Diagnosis not present

## 2018-05-28 DIAGNOSIS — D2261 Melanocytic nevi of right upper limb, including shoulder: Secondary | ICD-10-CM | POA: Diagnosis not present

## 2018-05-30 DIAGNOSIS — Z6835 Body mass index (BMI) 35.0-35.9, adult: Secondary | ICD-10-CM | POA: Diagnosis not present

## 2018-05-30 DIAGNOSIS — E786 Lipoprotein deficiency: Secondary | ICD-10-CM | POA: Diagnosis not present

## 2018-05-30 DIAGNOSIS — E669 Obesity, unspecified: Secondary | ICD-10-CM | POA: Diagnosis not present

## 2018-05-30 DIAGNOSIS — E291 Testicular hypofunction: Secondary | ICD-10-CM | POA: Diagnosis not present

## 2018-06-03 DIAGNOSIS — G4733 Obstructive sleep apnea (adult) (pediatric): Secondary | ICD-10-CM | POA: Diagnosis not present

## 2018-06-17 DIAGNOSIS — K080 Exfoliation of teeth due to systemic causes: Secondary | ICD-10-CM | POA: Diagnosis not present

## 2018-06-24 DIAGNOSIS — E291 Testicular hypofunction: Secondary | ICD-10-CM | POA: Diagnosis not present

## 2018-07-05 DIAGNOSIS — J069 Acute upper respiratory infection, unspecified: Secondary | ICD-10-CM | POA: Diagnosis not present

## 2018-07-05 DIAGNOSIS — J029 Acute pharyngitis, unspecified: Secondary | ICD-10-CM | POA: Diagnosis not present

## 2018-07-12 DIAGNOSIS — E538 Deficiency of other specified B group vitamins: Secondary | ICD-10-CM | POA: Diagnosis not present

## 2018-07-12 DIAGNOSIS — E8881 Metabolic syndrome: Secondary | ICD-10-CM | POA: Diagnosis not present

## 2018-07-12 DIAGNOSIS — E291 Testicular hypofunction: Secondary | ICD-10-CM | POA: Diagnosis not present

## 2018-07-12 DIAGNOSIS — R7303 Prediabetes: Secondary | ICD-10-CM | POA: Diagnosis not present

## 2018-07-12 DIAGNOSIS — E786 Lipoprotein deficiency: Secondary | ICD-10-CM | POA: Diagnosis not present

## 2018-07-12 DIAGNOSIS — M791 Myalgia, unspecified site: Secondary | ICD-10-CM | POA: Diagnosis not present

## 2018-07-12 DIAGNOSIS — E781 Pure hyperglyceridemia: Secondary | ICD-10-CM | POA: Diagnosis not present

## 2018-07-12 DIAGNOSIS — R635 Abnormal weight gain: Secondary | ICD-10-CM | POA: Diagnosis not present

## 2018-07-12 DIAGNOSIS — E669 Obesity, unspecified: Secondary | ICD-10-CM | POA: Diagnosis not present

## 2018-07-16 DIAGNOSIS — Z6836 Body mass index (BMI) 36.0-36.9, adult: Secondary | ICD-10-CM | POA: Diagnosis not present

## 2018-07-16 DIAGNOSIS — E786 Lipoprotein deficiency: Secondary | ICD-10-CM | POA: Diagnosis not present

## 2018-07-16 DIAGNOSIS — R7303 Prediabetes: Secondary | ICD-10-CM | POA: Diagnosis not present

## 2018-07-16 DIAGNOSIS — E669 Obesity, unspecified: Secondary | ICD-10-CM | POA: Diagnosis not present

## 2018-07-24 DIAGNOSIS — G4733 Obstructive sleep apnea (adult) (pediatric): Secondary | ICD-10-CM | POA: Diagnosis not present

## 2018-07-31 DIAGNOSIS — F411 Generalized anxiety disorder: Secondary | ICD-10-CM | POA: Diagnosis not present

## 2018-08-22 DIAGNOSIS — F411 Generalized anxiety disorder: Secondary | ICD-10-CM | POA: Diagnosis not present

## 2018-09-02 ENCOUNTER — Encounter: Payer: Self-pay | Admitting: Sports Medicine

## 2018-09-05 ENCOUNTER — Ambulatory Visit: Payer: Federal, State, Local not specified - PPO | Admitting: Sports Medicine

## 2018-09-05 ENCOUNTER — Encounter: Payer: Self-pay | Admitting: Sports Medicine

## 2018-09-05 VITALS — BP 136/88 | Ht 73.0 in | Wt 255.0 lb

## 2018-09-05 DIAGNOSIS — M4802 Spinal stenosis, cervical region: Secondary | ICD-10-CM

## 2018-09-05 DIAGNOSIS — M898X1 Other specified disorders of bone, shoulder: Secondary | ICD-10-CM | POA: Diagnosis not present

## 2018-09-05 MED ORDER — GABAPENTIN 300 MG PO CAPS: ORAL_CAPSULE | ORAL | 0 refills | Status: DC

## 2018-09-05 NOTE — Assessment & Plan Note (Addendum)
Does appear to have some slight canal narrowing at C5-C6 proximal to his repair at C6-C7.  With some overhead activities he is likely having some manner of impingement, which is irritating the nerves exiting spinal canal.  We will try him on a low-dose of gabapentin 300 mg at bedtime.  Also gave him instruction on types of exercises he can do to avoid irritating this area.  He will follow-up in around 3 weeks to see how the gabapentin has been helping.  - Gabapentin 300 mg at bedtime - Stretching and exercises to avoid irritating C5-C6 narrowing - Follow-up around 3 weeks

## 2018-09-05 NOTE — Assessment & Plan Note (Signed)
This may be relative but measured at 9.5 mm at one level Should be at least 11 mm and at his size probably more

## 2018-09-05 NOTE — Patient Instructions (Signed)
It was great meeting you today Mr. Joshua Pearson!  Sorry for having so much pain with your back.  It looks like you likely have a little bit of spinal cord narrowing due to just a little bit of disc protrusion at the level above your past Cervical fusion.  We will try a nerve blocking medication called gabapentin to see if this will help control your symptoms.  We also recommend limiting weight-based back exercises for the near future.  Exercise you can try to see if they help or an upright row, chest fly (not full range of motion), benchpress.  Please come back and see Dr. fields in around 3 weeks to see if your symptoms have improved with gabapentin.

## 2018-09-05 NOTE — Progress Notes (Signed)
   Subjective:    Patient ID: Joshua Pearson, male    DOB: 12/30/1965, 52 y.o.   MRN: [id number]  HPI 52 year old who presents for chronic left periscapular pain.  Patient last seen in presents in clinic back in 2017.  He was found to have a large right posterior lateral foraminal disc herniation at C6-7, and had right C7 nerve root compression.  Was also noted to have shallow disc protrusion at C5-6.  Patient underwent C6-C7  fusion and microdiscectomy at that time.  He states that he got around 12 months of relief from his pain.  Since that time his pain has insidiously increased.  It is still the same periscapular pain, but he was having prior to surgery.  He states that overall his pain is much better than prior to surgery, but it is keeping him from playing tennis and being as active as he wants to.  Patient states his pain is currently between 2-6.  When he is not participating in physical activity is usually a 2 but as soon as he does physical activity it will jump up to 6.  Review of Systems No weakness in hand No arm weakness No cough or sneeze pain  Medications, social history, allergies reviewed    Objective:   Physical Exam GEN: No acute distress, resting comfortably, middle-aged Caucasian male, sitting at edge of table BP 136/88   Ht 6\' 1"  (1.854 m)   Wt 255 lb (115.7 kg)   BMI 33.64 kg/m   Cards: Warm, well-perfused.  Palpable radial pulse bilaterally. Pulm: Comfortable work of breathing, no accessory muscle use. Neuro: No gait at abnormality noted, negative Spurling sign bilaterally.   MSK: Palpable tenderness at mid right scapula.  With repeated overhead extension patient's rhomboids appear to spasm. Patient with negative Neers, negative empty cup, negative Hawkins. Right Scapula:  Inspection: no abnormality, symmetric with left Strength: No focal weakness, 5/5 Range of motion: Intact Warm, well perfused scapula. Cap refill <2 seconds.  Review of MRI There remains  some disc bulging C5/6 a d C6/7 There is relative spinal stenosis at the level with one area measuring only 9.5 mm    Assessment & Plan:  Assessment 52 year old male who presents with right parascapular pain.  Reviewed MRI results from 2018.  Does appear to have some slight canal narrowing at C5-C6 proximal to his repair at C6-C7.  With some overhead activities he is likely having some impingement, which is irritating the nerves exiting spinal canal.  We will try him on a low-dose of gabapentin 300 mg at bedtime.  Also gave him instruction on types of exercises he can do to avoid irritating this area.  He will follow-up in around 3 weeks to see how the gabapentin has been helping.  Plan Gabapentin 300 mg at bedtime Stretching and exercises to avoid irritating C5-C6 narrowing Follow-up around 3 weeks

## 2018-09-17 ENCOUNTER — Telehealth: Payer: Self-pay

## 2018-09-17 MED ORDER — CYCLOBENZAPRINE HCL 10 MG PO TABS: 10.0000 mg | ORAL_TABLET | Freq: Every evening | ORAL | 0 refills | Status: DC | PRN

## 2018-09-17 NOTE — Telephone Encounter (Signed)
Per Dr. Oneida Alar he can try Flexeril 10 mg at nighttime. He should continue his home exercises. If he isn't seeing any improvement then he can try formal physical therapy. Pt understands. He will discuss setting up PT and his appt next week.

## 2018-09-20 ENCOUNTER — Other Ambulatory Visit: Payer: Self-pay | Admitting: Family Medicine

## 2018-09-26 ENCOUNTER — Ambulatory Visit: Payer: Federal, State, Local not specified - PPO | Admitting: Sports Medicine

## 2018-09-26 ENCOUNTER — Encounter

## 2018-09-26 ENCOUNTER — Encounter: Payer: Self-pay | Admitting: Sports Medicine

## 2018-09-26 ENCOUNTER — Ambulatory Visit: Payer: Self-pay | Admitting: Sports Medicine

## 2018-09-26 ENCOUNTER — Other Ambulatory Visit: Payer: Self-pay | Admitting: Psychiatry

## 2018-09-26 VITALS — BP 120/84 | Ht 73.0 in | Wt 255.0 lb

## 2018-09-26 DIAGNOSIS — M898X1 Other specified disorders of bone, shoulder: Secondary | ICD-10-CM | POA: Diagnosis not present

## 2018-09-26 DIAGNOSIS — M4802 Spinal stenosis, cervical region: Secondary | ICD-10-CM | POA: Diagnosis not present

## 2018-09-26 MED ORDER — TRAMADOL HCL 50 MG PO TABS: 50.0000 mg | ORAL_TABLET | Freq: Three times a day (TID) | ORAL | 0 refills | Status: DC

## 2018-09-26 NOTE — Patient Instructions (Signed)
Periscapular pain on the R  - We will start Tramadol 50mg  three times per day for one week. If this is helping, than you may call for a refill of this medication to use in the short term 2-3x per day.  - You may continue to take Flexeril at night. Caution on dizziness  - If not improving with Tramadol, recommend contacting [facility] to get back into physical therapy and to review your work out routine.  - Keep up the great work with weight loss.

## 2018-09-26 NOTE — Progress Notes (Signed)
Joshua Pearson - 52 y.o. male MRN [id number]  Date of birth: July 25, 1966   Chief complaint: R periscapular pain  SUBJECTIVE:    History of present illness: [name] with a pmh significant for C6-7 cervical fusion and microdiskectomy who presents today for follow up of R sided periscapular pain. He did have an MRI from 2018 which demonstrated his fusion and showed slight spinal stenosis measuring at 9.3mm. He has currently tried therapeutic massage which worsened his pain and discomfort. He also tried Gabapentin 300mg  at night which caused a 5lb weight gain in one week. He is in the process of being managed by a weight management clinic who recommended d/c of gabapentin. He was transitioned to Flexeril 10mg  at night which reduces his bothersome periscapular pain by 15%. He still has symptoms daily that are worse with prolonged exercise and weight lifting overhead.  Yesterday, he was doing shoulder press, LAT pull downs, benchpress, and rose at the gym.  These did worsen his symptoms  to a 6 out of 10 sharp pain in his right periscapular region. Other symptoms: He does get numbness of his right arm after prolonged ambulation which does improve if he places his arm above his head.  No baseline weakness of his upper or lower extremities.  Of note, the patient did see his neurologist who recommended considering epidural steroid injections.  His weight loss clinic also recommended a vitamin B complex to assist with his vitamin B12 deficiency.  His most recent vitamin B12 levels were within normal limits.   Review of systems:  As stated above   Interval past medical history, surgical history, family history, and social history obtained and are unchanged.   He is a non-smoker.  Pertinent positives include: History of AVM with repair, recent weight loss, neck pain, and obesity  Medications reviewed: Pertinent changes include discontinuing gabapentin Allergies reviewed: Includes doxycycline which she has  anaphylaxis  OBJECTIVE:  Physical exam: Vital signs are reviewed. BP 120/84   Ht 6\' 1"  (1.854 m)   Wt 255 lb (115.7 kg)   BMI 33.64 kg/m   Gen.: Alert, oriented, appears stated age, in no apparent distress HEENT: Moist oral mucosa Integumentary: No rashes or ecchymoses Neurologic:  Positive Spurling's test with radiating numbness into his right upper extremity, no baseline diminished sensation in the bilateral upper extremities, reflexes C5 6+2, C6 7+2 Gait: normal without associated limp Back: He has mild midline tenderness in the cervical region and periscapular tenderness upon palpation of the medial border of the scapula.  This does not improve with postural movements however it is reproducible to palpation.  Decreased range of motion in neck extension secondary to pain and numbness Musculoskeletal: Full range of motion in all 4 extremities, strength testing is 5 out of 5 in shoulder flexion and extension, 5 out of 5 in elbow flexion extension, 5 out of 5 in shoulder abduction and adduction    ASSESSMENT & PLAN: Periscapular pain - suspect referred pain from cervical stenosis - d/c hydrocodone - start Tramadol 50mg  TID x 1 week, patient able to call for refills if helping - Continue Flexeril 10mg  qHS - if tramadol not working, consider increasing Flexeril to TID and d/c Tramadol - PT to contact [facility] Bart[facility] if desires physical therapy as he has an established relationship - caution on overhead lifting exercises - if still not improving or worsening, will repeat Cervical MRI and discuss utility of Neurosurg referral vs ESI  Spinal stenosis in cervical region - most recent  MRI from 2018 with 9.45mm narrowing at C5-6  Also discussed possibility of Pilates as a good approach to working on strength and safe flexibility  Meds ordered this encounter  Medications  . traMADol (ULTRAM) 50 MG tablet    Sig: Take 1 tablet (50 mg total) by mouth 3 (three) times daily.    Dispense:  21  tablet    Refill:  0      Clydene Laming, DO Sports Medicine Fellow [place] observed and examined the patient with the [facility] Fellow and agree with assessment and plan.  Note reviewed and modified by me. Stefanie Libel, MD

## 2018-09-26 NOTE — Assessment & Plan Note (Signed)
-   most recent MRI from 2018 with 9.25mm narrowing at C5-6

## 2018-09-26 NOTE — Assessment & Plan Note (Signed)
-   suspect referred pain from cervical stenosis - d/c hydrocodone - start Tramadol 50mg  TID x 1 week, patient able to call for refills if helping - Continue Flexeril 10mg  qHS - if tramadol not working, consider increasing Flexeril to TID and d/c Tramadol - PT to contact [facility] Barthel if desires physical therapy as he has an established relationship - caution on overhead lifting exercises - if still not improving or worsening, will repeat Cervical MRI and discuss utility of Neurosurg referral vs ESI

## 2018-09-27 NOTE — Telephone Encounter (Signed)
Advise viibryd was d/c'd by Sports Medicine  09/26/18.  Chart in box. Send response to Traci if they should continue.

## 2018-09-30 NOTE — Telephone Encounter (Signed)
Provider aware and will review chart.

## 2018-10-02 ENCOUNTER — Other Ambulatory Visit: Payer: Self-pay

## 2018-10-02 ENCOUNTER — Telehealth: Payer: Self-pay

## 2018-10-02 MED ORDER — TRAMADOL HCL 50 MG PO TABS: 50.0000 mg | ORAL_TABLET | Freq: Three times a day (TID) | ORAL | 0 refills | Status: DC

## 2018-10-02 NOTE — Telephone Encounter (Signed)
Order placed. Pt able to pick up refill tomorrow.

## 2018-10-11 ENCOUNTER — Encounter: Payer: Self-pay | Admitting: Psychiatry

## 2018-10-11 ENCOUNTER — Ambulatory Visit (INDEPENDENT_AMBULATORY_CARE_PROVIDER_SITE_OTHER): Payer: Federal, State, Local not specified - PPO | Admitting: Psychiatry

## 2018-10-11 DIAGNOSIS — F09 Unspecified mental disorder due to known physiological condition: Secondary | ICD-10-CM

## 2018-10-11 DIAGNOSIS — F5081 Binge eating disorder: Secondary | ICD-10-CM

## 2018-10-11 DIAGNOSIS — F411 Generalized anxiety disorder: Secondary | ICD-10-CM | POA: Diagnosis not present

## 2018-10-11 MED ORDER — VILAZODONE HCL 20 MG PO TABS: 20.0000 mg | ORAL_TABLET | Freq: Every day | ORAL | 1 refills | Status: DC

## 2018-10-11 MED ORDER — L-METHYLFOLATE CALCIUM 15 MG PO TABS: 1.0000 | ORAL_TABLET | Freq: Every day | ORAL | 3 refills | Status: DC

## 2018-10-11 MED ORDER — LISDEXAMFETAMINE DIMESYLATE 50 MG PO CAPS: 50.0000 mg | ORAL_CAPSULE | Freq: Every day | ORAL | 0 refills | Status: DC

## 2018-10-11 NOTE — Progress Notes (Signed)
[facility]  Patient ID: Joshua Pearson,  MRN: [id number]  PCP: Shirline Frees, MD  Date of Evaluation: 10/11/2018 Time spent:30 minutes   HISTORY/CURRENT STATUS: HPI CC: pain and others as noted Medical concerns with pain from spinal stenosis.  Rx meds Helping some Asks about Vyvanse for weight loss per the weight loss doctor.  I suggested he get clearance from neuro DT slight SZ risks.  Neuro, Dr. Lisabeth Devoid OK this.  Has binge eating but it's usually in the evening.  Wants to lose more weight to get off CPAP and needs to lose another 30-40#. He has some anxiety related to health issues and stressors but is not overwhelming.  He feels like the Viibryd is helping.  He is not markedly depressed.  He is not abusing substances. Individual Medical History/ Review of Systems: Changes? :Yes Going to [place] weight loss and lost 52# but stalled. ED. Hx AVM frontal lobe removed 2002 and subsequent cog complaints since then. Attending   Allergies: Doxycycline  Current Medications:  Current Outpatient Medications:  .  Cholecalciferol (D 5000) 5000 units TABS, Take by mouth., Disp: , Rfl:  .  cyclobenzaprine (FLEXERIL) 10 MG tablet, Take 1 tablet (10 mg total) by mouth at bedtime as needed for muscle spasms., Disp: 30 tablet, Rfl: 0 .  L-methylfolate Calcium 15 MG TABS, , Disp: , Rfl:  .  lansoprazole (PREVACID 24HR) 15 MG capsule, Take by mouth., Disp: , Rfl:  .  Levetiracetam (KEPPRA XR) 750 MG TB24, Take by mouth., Disp: , Rfl:  .  levothyroxine (SYNTHROID, LEVOTHROID) 75 MCG tablet, , Disp: , Rfl:  .  loratadine (CLARITIN) 10 MG tablet, Take by mouth., Disp: , Rfl:  .  Magnesium 500 MG CAPS, Take 500 mg by mouth., Disp: , Rfl:  .  modafinil (PROVIGIL) 200 MG tablet, , Disp: , Rfl:  .  naproxen sodium (ALEVE) 220 MG tablet, Take by mouth., Disp: , Rfl:  .  omega-3 acid ethyl esters (LOVAZA) 1 g capsule, Take by mouth., Disp: , Rfl:  .  Phosphatidylserine 100 MG CAPS,  Take 1 capsule by mouth daily., Disp: , Rfl:  .  testosterone cypionate (DEPOTESTOSTERONE CYPIONATE) 200 MG/ML injection, Inject into the muscle. Inject into muscle every 14 days, Disp: , Rfl:  .  traMADol (ULTRAM) 50 MG tablet, Take 1 tablet (50 mg total) by mouth 3 (three) times daily. (Patient taking differently: Take 50 mg by mouth every 6 (six) hours as needed. ), Disp: 90 tablet, Rfl: 0 .  Vilazodone HCl (VIIBRYD) 20 MG TABS, Take 20 mg by mouth daily., Disp: , Rfl:  .  DENTA 5000 PLUS 1.1 % CREA dental cream, , Disp: , Rfl:  .  hydrOXYzine (ATARAX/VISTARIL) 25 MG tablet, TAKE 1 TABLET ONCE A DAY AT BEDTIME FOR INSONIA AS NEEDED ORALLY 30, Disp: , Rfl: 5 .  metFORMIN (GLUCOPHAGE) 500 MG tablet, Take 500 mg by mouth daily., Disp: , Rfl: 0 .  sodium fluoride (DENTA 5000 PLUS) 1.1 % CREA dental cream, USE AS DIRECTED, Disp: , Rfl:  Medication Side Effects: None  Family Medical/ Social History: Changes? Yes Exercise 2 hours 6 days/week.  MENTAL HEALTH EXAM:  There were no vitals taken for this visit.There is no height or weight on file to calculate BMI.  General Appearance: Casual  Eye Contact:  Good  Speech:  Clear and Coherent and Normal Rate  Volume:  Normal  Mood:  Anxious  Affect:  Appropriate and Full Range  Thought  Process:  Coherent and Goal Directed  Orientation:  Full (Time, Place, and Person)  Thought Content: Logical   Suicidal Thoughts:  No  Homicidal Thoughts:  No  Memory:  Recent  Judgement:  Fair  Insight:  Fair  Psychomotor Activity:  Normal  Concentration:  Concentration: Fair  Recall:  Good  Fund of Knowledge: Good  Language: Good  Akathisia:  No  AIMS (if indicated): not done  Assets:  Desire for Improvement Financial Resources/Insurance Housing Talents/Skills Transportation Vocational/Educational  ADL's:  Intact  Cognition: WNL  Prognosis:  Good    DIAGNOSES:    ICD-10-CM   1. Binge eating disorder F50.81   2. Organic brain syndrome (chronic)  F09   3. Generalized anxiety disorder F41.1     RECOMMENDATIONS: Greater than 50% of face to face time with patient was spent on counseling and coordination of care. We discussed extensively his efforts to lose weight including going to the [facility] weight loss center.  They suggested the possibility of using Vyvanse for binge eating in place of the modafinil.  We discussed this in detail given his history of anxiety and the potential for stimulants to worsen anxiety.  Also given the benefit of modafinil for his cognitive problems related to having had an AV malformation removed from his brain.  He has had chronic complaints of difficulty concentrating and easy fatigability since that time.  I requested that he get clearance from his neurologist if we were going to use the Vyvanse.  He provided that note from Dr. Lisabeth Devoid suggesting that it is reasonable in spite of the low risk of seizures.  His seizures have been under control for quite some time so he was supportive of the change.  Stop modafanil. Start Vyvanse 50 mg q AM.  Disc SE.  Call in 2 weeks  And we'll increase the dose if needed.  We had received a message that he had stopped the Viibryd under recommendations from sports medicine but the patient states that information was not correct.  We discussed the risk of serotonin syndrome taking Viibryd and tramadol together but he has had no such symptoms.  Follow-up 6 with  Purnell Shoemaker, MD

## 2018-10-11 NOTE — Telephone Encounter (Signed)
The area noted about discontinuing Viibryd was then error.  He is still taking the medication.  It was renewed today.

## 2018-10-14 ENCOUNTER — Other Ambulatory Visit: Payer: Self-pay

## 2018-10-14 MED ORDER — CYCLOBENZAPRINE HCL 10 MG PO TABS: 10.0000 mg | ORAL_TABLET | Freq: Every evening | ORAL | 3 refills | Status: DC | PRN

## 2018-10-15 ENCOUNTER — Telehealth: Payer: Self-pay

## 2018-10-15 NOTE — Telephone Encounter (Signed)
PA approved for Vyvanse 50mg  effective 09/15/2018 thru 10/15/2019

## 2018-10-23 ENCOUNTER — Ambulatory Visit: Payer: Federal, State, Local not specified - PPO | Admitting: Psychiatry

## 2018-10-28 ENCOUNTER — Other Ambulatory Visit: Payer: Self-pay | Admitting: Psychiatry

## 2018-10-28 NOTE — Progress Notes (Signed)
Pt asking for refill Vyvanse and wording is vague about whether he's asking for an increase.  If he's tolerating it and wants an increase to 60 mg each am, then OK. Lynder Parents MD

## 2018-10-29 ENCOUNTER — Other Ambulatory Visit: Payer: Self-pay | Admitting: Psychiatry

## 2018-10-29 MED ORDER — LISDEXAMFETAMINE DIMESYLATE 60 MG PO CAPS: 60.0000 mg | ORAL_CAPSULE | Freq: Every day | ORAL | 0 refills | Status: DC

## 2018-10-29 NOTE — Progress Notes (Signed)
Ok pt request to increase vyvanse to 60

## 2018-11-03 DIAGNOSIS — F411 Generalized anxiety disorder: Secondary | ICD-10-CM | POA: Insufficient documentation

## 2018-11-08 ENCOUNTER — Telehealth: Payer: Self-pay | Admitting: Psychiatry

## 2018-11-08 ENCOUNTER — Other Ambulatory Visit: Payer: Self-pay | Admitting: Psychiatry

## 2018-11-08 ENCOUNTER — Other Ambulatory Visit: Payer: Self-pay

## 2018-11-08 DIAGNOSIS — F5081 Binge eating disorder: Secondary | ICD-10-CM

## 2018-11-08 MED ORDER — LISDEXAMFETAMINE DIMESYLATE 60 MG PO CAPS: 60.0000 mg | ORAL_CAPSULE | Freq: Every day | ORAL | 0 refills | Status: DC

## 2018-11-08 NOTE — Telephone Encounter (Signed)
error 

## 2018-11-08 NOTE — Progress Notes (Signed)
vyvanse to [employer]

## 2018-11-08 NOTE — Telephone Encounter (Signed)
PT STATED HE HAD A SCRIPT FOR VYVANSE 60 MG.FOR 30 DAYS WAS GIVEN A COUPON THAT DID NOT CUT DOWN ON COST HE WAS BEING CHARGED $82 FOR 30 DAYS, PT WOULD LIKE SCRIPT FOR VYVANSE SENT TO [facility] FOR 90 DAYS.  LAST APPT. WAS 10/11/2018, UPCOMING APPT. 11/20/2018

## 2018-11-20 ENCOUNTER — Ambulatory Visit: Payer: Federal, State, Local not specified - PPO | Admitting: Psychiatry

## 2018-11-20 ENCOUNTER — Encounter: Payer: Self-pay | Admitting: Psychiatry

## 2018-11-20 DIAGNOSIS — F5081 Binge eating disorder: Secondary | ICD-10-CM | POA: Diagnosis not present

## 2018-11-20 DIAGNOSIS — F411 Generalized anxiety disorder: Secondary | ICD-10-CM

## 2018-11-20 DIAGNOSIS — F5105 Insomnia due to other mental disorder: Secondary | ICD-10-CM

## 2018-11-20 MED ORDER — LISDEXAMFETAMINE DIMESYLATE 60 MG PO CAPS: 60.0000 mg | ORAL_CAPSULE | Freq: Every day | ORAL | 0 refills | Status: DC

## 2018-11-20 NOTE — Progress Notes (Signed)
Joshua Pearson [id number] Jan 20, 1966 52 y.o.  Subjective:   Patient ID:  Joshua Pearson is a 52 y.o. (DOB 01-28-1966) male.  Chief Complaint:  Chief Complaint  Patient presents with  . Obesity  . Follow-up    change meds  . ADHD    HPI FELIZ LINCOLN presents to the office today for follow-up of conc and mood is better with the change from Provigil to Vyvanse.  Wonders about the reduction of Viiibryd to help sexual SE and weight..  Holidays not that stressful.  Goes to therapist and [facility] and has good support.  Thinks both Vyvanse and the Provigil bothers sleep somewhat.  6-7 hour sof sleep.  Off both seemed deeper sleep but in a fog. No appetite suppressant effect from Vyvanse.  Better mood and focus.  A little more irritable on it and a little jittery.  Review of Systems:  Review of Systems  Neurological: Negative for tremors and weakness.  Psychiatric/Behavioral: Positive for decreased concentration. Negative for agitation, behavioral problems, confusion, dysphoric mood, hallucinations, self-injury, sleep disturbance and suicidal ideas. The patient is not nervous/anxious and is not hyperactive.     Medications: I have reviewed the patient's current medications.  Current Outpatient Medications  Medication Sig Dispense Refill  . L-methylfolate Calcium 15 MG TABS Take 1 tablet by mouth daily. 90 tablet 3  . lansoprazole (PREVACID 24HR) 15 MG capsule Take by mouth.    . Levetiracetam (KEPPRA XR) 750 MG TB24 Take by mouth.    . levothyroxine (SYNTHROID, LEVOTHROID) 75 MCG tablet     . lisdexamfetamine (VYVANSE) 60 MG capsule Take 1 capsule (60 mg total) by mouth daily. 90 capsule 0  . loratadine (CLARITIN) 10 MG tablet Take by mouth.    Marland Kitchen LORazepam (ATIVAN) 0.5 MG tablet Take 0.5 mg by mouth daily as needed for anxiety (1-2 tabs).    . metFORMIN (GLUCOPHAGE) 500 MG tablet Take 500 mg by mouth daily.  0  . modafinil (PROVIGIL) 200 MG tablet Take 200 mg by mouth 2 (two) times daily. 1  tab qam & 1 tab at noon    . sildenafil (REVATIO) 20 MG tablet Take 20 mg by mouth daily as needed (3-5 tabs).    Marland Kitchen testosterone cypionate (DEPOTESTOSTERONE CYPIONATE) 200 MG/ML injection Inject into the muscle. Inject into muscle every 14 days    . traMADol (ULTRAM) 50 MG tablet Take 1 tablet (50 mg total) by mouth 3 (three) times daily. (Patient taking differently: Take 50 mg by mouth every 6 (six) hours as needed. ) 90 tablet 0  . Vilazodone HCl (VIIBRYD) 20 MG TABS Take 1 tablet (20 mg total) by mouth daily. 90 tablet 1  . busPIRone (BUSPAR) 5 MG tablet Take 10 mg by mouth 2 (two) times daily.    . Cholecalciferol (D 5000) 5000 units TABS Take by mouth.    . cyclobenzaprine (FLEXERIL) 10 MG tablet Take 1 tablet (10 mg total) by mouth at bedtime as needed for muscle spasms. 30 tablet 3  . DENTA 5000 PLUS 1.1 % CREA dental cream     . hydrOXYzine (ATARAX/VISTARIL) 25 MG tablet TAKE 1 TABLET ONCE A DAY AT BEDTIME FOR INSONIA AS NEEDED ORALLY 30  5  . Magnesium 500 MG CAPS Take 500 mg by mouth.    . mirtazapine (REMERON) 7.5 MG tablet Take 7.5 mg by mouth at bedtime.    . naproxen sodium (ALEVE) 220 MG tablet Take by mouth.    . omega-3 acid ethyl esters (  LOVAZA) 1 g capsule Take by mouth.    . Phosphatidylserine 100 MG CAPS Take 1 capsule by mouth daily.    . sodium fluoride (DENTA 5000 PLUS) 1.1 % CREA dental cream USE AS DIRECTED     No current facility-administered medications for this visit.     Medication Side Effects: None  Allergies:  Allergies  Allergen Reactions  . Doxycycline Anaphylaxis    History reviewed. No pertinent past medical history.  History reviewed. No pertinent family history.  Social History   Socioeconomic History  . Marital status: Married    Spouse name: Not on file  . Number of children: Not on file  . Years of education: Not on file  . Highest education level: Not on file  Occupational History  . Not on file  Social Needs  . Financial resource  strain: Not on file  . Food insecurity:    Worry: Not on file    Inability: Not on file  . Transportation needs:    Medical: Not on file    Non-medical: Not on file  Tobacco Use  . Smoking status: Never Smoker  Substance and Sexual Activity  . Alcohol use: Not on file  . Drug use: Not on file  . Sexual activity: Not on file  Lifestyle  . Physical activity:    Days per week: Not on file    Minutes per session: Not on file  . Stress: Not on file  Relationships  . Social connections:    Talks on phone: Not on file    Gets together: Not on file    Attends religious service: Not on file    Active member of club or organization: Not on file    Attends meetings of clubs or organizations: Not on file    Relationship status: Not on file  . Intimate partner violence:    Fear of current or ex partner: Not on file    Emotionally abused: Not on file    Physically abused: Not on file    Forced sexual activity: Not on file  Other Topics Concern  . Not on file  Social History Narrative  . Not on file    Past Medical History, Surgical history, Social history, and Family history were reviewed and updated as appropriate.   Please see review of systems for further details on the patient's review from today.   Objective:   Physical Exam:  There were no vitals taken for this visit.  Physical Exam  Constitutional: He is oriented to person, place, and time. He appears well-developed. No distress.  Musculoskeletal: He exhibits no deformity.  Neurological: He is alert and oriented to person, place, and time. He displays no tremor. Coordination and gait normal.  Psychiatric: He has a normal mood and affect. His speech is normal and behavior is normal. Judgment and thought content normal. His mood appears not anxious. His affect is not angry, not blunt, not labile and not inappropriate. Cognition and memory are normal. He does not exhibit a depressed mood. He expresses no homicidal and no  suicidal ideation. He expresses no suicidal plans and no homicidal plans.  Insight intact. No auditory or visual hallucinations. No delusions.  He is attentive.    Lab Review:  No results found for: NA, K, CL, CO2, GLUCOSE, BUN, CREATININE, CALCIUM, PROT, ALBUMIN, AST, ALT, ALKPHOS, BILITOT, GFRNONAA, GFRAA  No results found for: WBC, RBC, HGB, HCT, PLT, MCV, MCH, MCHC, RDW, LYMPHSABS, MONOABS, EOSABS, BASOSABS  No results found  for: POCLITH, LITHIUM   No results found for: PHENYTOIN, PHENOBARB, VALPROATE, CBMZ   .res Assessment: Plan:    Extreme binge-eating disorder  Generalized anxiety disorder  Insomnia due to mental condition  He wants tro try to decrease the Viibryd as the next step.  Watch for higher depression dn anxiety and less weight gain. Drop to 10 mg daily  Cont Vyvanse DT better energy, mood, focus.  Option later per weight loss doc alternative to Vyvanse phentermine.  Weight still stuck by trying alternative eating strategies too.  Ok trial melatonin.  FU 2mos  Lynder Parents, MD, DFAPA    Please see After Visit Summary for patient specific instructions.  No future appointments.  No orders of the defined types were placed in this encounter.     -------------------------------

## 2018-12-31 ENCOUNTER — Ambulatory Visit: Payer: Federal, State, Local not specified - PPO | Admitting: Sports Medicine

## 2018-12-31 ENCOUNTER — Encounter: Payer: Self-pay | Admitting: Sports Medicine

## 2018-12-31 DIAGNOSIS — M4802 Spinal stenosis, cervical region: Secondary | ICD-10-CM | POA: Diagnosis not present

## 2018-12-31 MED ORDER — METHOCARBAMOL 750 MG PO TABS: ORAL_TABLET | ORAL | 2 refills | Status: DC

## 2018-12-31 NOTE — Progress Notes (Signed)
CC: Known cervical spinal stenosis with radiating pain to left shoulder  Patient has had a C6-7 cervical fusion and a microdiscectomy He has a known area of localized cervical spinal stenosis with a canal width of only 9.5 mm He continues to have some persistent symptoms particularly radiating into his left shoulder but sometimes into the right periscapular area  On last visit we switched him to tramadol and Flexeril These medications really helped and he stopped taking the tramadol except when he gets a bad flare The Flexeril lessen the spasm a good bit but gave him symptoms of erectile dysfunction At one half the dose of Flexeril he had less side effects but still was somewhat drowsy  Periscapular pain is definitely improved However after lifting he still gets significant posterior left shoulder pain and feels sometimes that this is weak  Review of systems No current neck pain No weakness in the hand or forearm Insomnia that is helped by medications  Physical exam Muscular white male in no acute distress BP (!) 152/90   Ht 6\' 1"  (1.854 m)   Wt 242 lb (109.8 kg)   BMI 31.93 kg/m   He has good posture and good neck motion today No pain to palpation around the periscapular region Full range of motion of both shoulders There is some trapezius spasm on the left Strength testing for external rotation abduction internal rotation are all strong and I did not tell a difference at the left shoulder I do not see any visible atrophy at the left shoulder

## 2018-12-31 NOTE — Assessment & Plan Note (Signed)
I think this is causing radiating pain The pain centers in the posterior shoulder and seems to have an association with trapezius spasm  Since he got such good benefit with the Flexeril but too many side effects we will switch to Robaxin 750 mg to 1500 mg 3 times daily as needed He can try this primarily after workouts because that is when he notices the symptoms most  He has tramadol on hand but does not need to use it regularly and can use it only if he gets a bad flare  Vistaril at night seems to help his sleep pattern and I told him he could continue that

## 2019-01-20 ENCOUNTER — Ambulatory Visit: Payer: Federal, State, Local not specified - PPO | Admitting: Psychiatry

## 2019-01-28 ENCOUNTER — Other Ambulatory Visit: Payer: Self-pay | Admitting: Psychiatry

## 2019-01-28 ENCOUNTER — Telehealth: Payer: Self-pay | Admitting: Psychiatry

## 2019-01-28 DIAGNOSIS — F900 Attention-deficit hyperactivity disorder, predominantly inattentive type: Secondary | ICD-10-CM

## 2019-01-28 MED ORDER — LISDEXAMFETAMINE DIMESYLATE 50 MG PO CAPS: 50.0000 mg | ORAL_CAPSULE | Freq: Every day | ORAL | 0 refills | Status: DC

## 2019-01-28 NOTE — Telephone Encounter (Signed)
Yes try reducing the dose to Vyvanse 50 mg sent in.

## 2019-01-28 NOTE — Telephone Encounter (Signed)
Pt request refill Vyance currently taking 60 mg focus great can't seem to stay a sleep. May need to decrease to lower dose. Pharm [facility]

## 2019-01-28 NOTE — Progress Notes (Signed)
Okayed reduction in Vyvanse from 60-50 due to insomnia.  Good response Lynder Parents, MD, DFAPA

## 2019-01-28 NOTE — Telephone Encounter (Signed)
Please review

## 2019-01-29 NOTE — Telephone Encounter (Signed)
Left message rx sent to pharmacy

## 2019-02-07 DIAGNOSIS — G4733 Obstructive sleep apnea (adult) (pediatric): Secondary | ICD-10-CM | POA: Diagnosis not present

## 2019-02-07 DIAGNOSIS — E669 Obesity, unspecified: Secondary | ICD-10-CM | POA: Diagnosis not present

## 2019-02-07 DIAGNOSIS — E786 Lipoprotein deficiency: Secondary | ICD-10-CM | POA: Diagnosis not present

## 2019-02-07 DIAGNOSIS — Z6831 Body mass index (BMI) 31.0-31.9, adult: Secondary | ICD-10-CM | POA: Diagnosis not present

## 2019-02-12 ENCOUNTER — Encounter: Payer: Self-pay | Admitting: Psychiatry

## 2019-02-12 ENCOUNTER — Ambulatory Visit: Payer: Federal, State, Local not specified - PPO | Admitting: Psychiatry

## 2019-02-12 VITALS — BP 149/93 | HR 86

## 2019-02-12 DIAGNOSIS — F5081 Binge eating disorder: Secondary | ICD-10-CM

## 2019-02-12 DIAGNOSIS — F50813 Binge eating disorder, extreme: Secondary | ICD-10-CM

## 2019-02-12 DIAGNOSIS — F5105 Insomnia due to other mental disorder: Secondary | ICD-10-CM

## 2019-02-12 DIAGNOSIS — F900 Attention-deficit hyperactivity disorder, predominantly inattentive type: Secondary | ICD-10-CM

## 2019-02-12 DIAGNOSIS — F411 Generalized anxiety disorder: Secondary | ICD-10-CM

## 2019-02-12 MED ORDER — LISDEXAMFETAMINE DIMESYLATE 30 MG PO CAPS: 30.0000 mg | ORAL_CAPSULE | Freq: Every day | ORAL | 0 refills | Status: DC

## 2019-02-12 NOTE — Patient Instructions (Signed)
Melatonin extended release 5-10 mg nightly.  Valerian root 2 capsules at night.  Hydroxyzine 25-50 mg nightly.  Do not combine hydroxyzine and diphenhydramine (Benadryl).  Option Belsomra prescription usually you need a week trial. 15 mg nightly.

## 2019-02-12 NOTE — Progress Notes (Signed)
Joshua Pearson [id number] [birth year] 52 y.o.  Subjective:   Patient ID:  Joshua Pearson is a 53 y.o. (DOB 07-05-1966) male.  Chief Complaint:  Chief Complaint  Patient presents with  . Follow-up    Medication Management  . ADD  . Sleeping Problem   Last seen November 20, 2018 HPI Joshua Pearson presents to the office today for follow-up of conc and mood is better with the change from Provigil to Vyvanse.    Started at 307# and now down 70# via [facility].  Wants tor try stop Viibryd.    Reduced Vyvanse and still problems with EMA even with the reduction in Vyvanse.  5-6 hours of sleep.  Experimenting with hydroxyzine works occ.  Melatonin 5mg  SR and maybe 6 hour of sleep.  Still feels a little wired on the Vyvanse.  Vyvanse helped him to reduce the calories.    He has fairly recently stopped buspirone, and mirtazapine.  At his last visit we reduced Viibryd to 10 mg daily.  He is trying to wean off some of the medications.  He wondered if the Viibryd can to contributed to weight gain.  On February 4 he called wanting to reduce the Vyvanse from 60 to 50 mg because he was having some trouble staying asleep.  That was accommodated  Wonders about the reduction of Viiibryd to help sexual SE and weight..  Holidays not that stressful.  Goes to therapist and [facility] and has good support.  Thinks both Vyvanse and the Provigil bothers sleep somewhat.  6-7 hour sof sleep.  Off both seemed deeper sleep but in a fog. No appetite suppressant effect from Vyvanse.  Better mood and focus.  A little more irritable on it and a little jittery.  Past psych meds:  NAC NR, Cerefolin NAC, trazodone NR, mirtazapine, modafinil, Viibryd 20 mg, Deplin, Paxil CR 12.5, duloxetine, Lexapro, Xanax XR  Review of Systems:  Review of Systems  Neurological: Negative for tremors and weakness.  Psychiatric/Behavioral: Positive for decreased concentration and sleep disturbance. Negative for agitation, behavioral  problems, confusion, dysphoric mood, hallucinations, self-injury and suicidal ideas. The patient is not nervous/anxious and is not hyperactive.     Medications: I have reviewed the patient's current medications.  Current Outpatient Medications  Medication Sig Dispense Refill  . Cholecalciferol (D 5000) 5000 units TABS Take by mouth.    . DENTA 5000 PLUS 1.1 % CREA dental cream     . Ibuprofen-diphenhydrAMINE Cit (MOTRIN PM) 200-38 MG TABS Take by mouth.    . L-methylfolate Calcium 15 MG TABS Take 1 tablet by mouth daily. 90 tablet 3  . lansoprazole (PREVACID 24HR) 15 MG capsule Take by mouth.    . Levetiracetam (KEPPRA XR) 750 MG TB24 Take by mouth.    . levothyroxine (SYNTHROID, LEVOTHROID) 75 MCG tablet     . lisdexamfetamine (VYVANSE) 50 MG capsule Take 1 capsule (50 mg total) by mouth daily. 30 capsule 0  . loratadine (CLARITIN) 10 MG tablet Take by mouth.    Marland Kitchen LORazepam (ATIVAN) 0.5 MG tablet Take 0.5 mg by mouth daily as needed for anxiety (1-2 tabs).    . Magnesium 500 MG CAPS Take 500 mg by mouth.    . magnesium gluconate (MAGONATE) 500 MG tablet Take 500 mg by mouth at bedtime.    . Melatonin 5 MG CAPS Take by mouth.    . naproxen sodium (ALEVE) 220 MG tablet Take by mouth.    . omega-3 acid ethyl  esters (LOVAZA) 1 g capsule Take by mouth.    . Phosphatidylserine 100 MG CAPS Take 1 capsule by mouth daily.    . sildenafil (REVATIO) 20 MG tablet Take 20 mg by mouth daily as needed (3-5 tabs).    Marland Kitchen testosterone cypionate (DEPOTESTOSTERONE CYPIONATE) 200 MG/ML injection Inject into the muscle. Inject into muscle every 14 days    . Vilazodone HCl (VIIBRYD) 20 MG TABS Take 1 tablet (20 mg total) by mouth daily. (Patient taking differently: Take 10 mg by mouth daily. ) 90 tablet 1  . vitamin B-12 (CYANOCOBALAMIN) 500 MCG tablet Take 5,000 mcg by mouth daily.    . cyclobenzaprine (FLEXERIL) 10 MG tablet Take 1 tablet (10 mg total) by mouth at bedtime as needed for muscle spasms. (Patient  not taking: Reported on 02/12/2019) 30 tablet 3  . lisdexamfetamine (VYVANSE) 30 MG capsule Take 1 capsule (30 mg total) by mouth daily. 30 capsule 0  . methocarbamol (ROBAXIN-750) 750 MG tablet Take 1-2 tabs three times a day as needed for muscle spasms. (Patient not taking: Reported on 02/12/2019) 180 tablet 2  . traMADol (ULTRAM) 50 MG tablet Take 1 tablet (50 mg total) by mouth 3 (three) times daily. (Patient not taking: Reported on 02/12/2019) 90 tablet 0   No current facility-administered medications for this visit.     Medication Side Effects: None  Allergies:  Allergies  Allergen Reactions  . Doxycycline Anaphylaxis  . Aspirin Other (See Comments)    dizziness    No past medical history on file.  No family history on file.  Social History   Socioeconomic History  . Marital status: Married    Spouse name: Not on file  . Number of children: Not on file  . Years of education: Not on file  . Highest education level: Not on file  Occupational History  . Not on file  Social Needs  . Financial resource strain: Not on file  . Food insecurity:    Worry: Not on file    Inability: Not on file  . Transportation needs:    Medical: Not on file    Non-medical: Not on file  Tobacco Use  . Smoking status: Never Smoker  . Smokeless tobacco: Never Used  Substance and Sexual Activity  . Alcohol use: Not on file  . Drug use: Not on file  . Sexual activity: Not on file  Lifestyle  . Physical activity:    Days per week: Not on file    Minutes per session: Not on file  . Stress: Not on file  Relationships  . Social connections:    Talks on phone: Not on file    Gets together: Not on file    Attends religious service: Not on file    Active member of club or organization: Not on file    Attends meetings of clubs or organizations: Not on file    Relationship status: Not on file  . Intimate partner violence:    Fear of current or ex partner: Not on file    Emotionally abused:  Not on file    Physically abused: Not on file    Forced sexual activity: Not on file  Other Topics Concern  . Not on file  Social History Narrative  . Not on file    Past Medical History, Surgical history, Social history, and Family history were reviewed and updated as appropriate.   Please see review of systems for further details on the patient's review from today.  Objective:   Physical Exam:  BP (!) 149/93 (BP Location: Left Arm)   Pulse 86   Physical Exam Constitutional:      General: He is not in acute distress.    Appearance: He is well-developed.  Musculoskeletal:        General: No deformity.  Neurological:     Mental Status: He is alert and oriented to person, place, and time.     Motor: No tremor.     Coordination: Coordination normal.     Gait: Gait normal.  Psychiatric:        Attention and Perception: He is attentive. He does not perceive auditory hallucinations.        Mood and Affect: Mood is not anxious or depressed. Affect is not labile, blunt, angry or inappropriate.        Speech: Speech normal.        Behavior: Behavior normal.        Thought Content: Thought content normal. Thought content does not include homicidal or suicidal ideation. Thought content does not include homicidal or suicidal plan.        Judgment: Judgment normal.     Comments: Insight intact. No auditory or visual hallucinations. No delusions.      Lab Review:  No results found for: NA, K, CL, CO2, GLUCOSE, BUN, CREATININE, CALCIUM, PROT, ALBUMIN, AST, ALT, ALKPHOS, BILITOT, GFRNONAA, GFRAA  No results found for: WBC, RBC, HGB, HCT, PLT, MCV, MCH, MCHC, RDW, LYMPHSABS, MONOABS, EOSABS, BASOSABS  No results found for: POCLITH, LITHIUM   No results found for: PHENYTOIN, PHENOBARB, VALPROATE, CBMZ   .res Assessment: Plan:    Insomnia due to mental condition  Extreme binge-eating disorder  Attention deficit hyperactivity disorder (ADHD), predominantly inattentive  type  Generalized anxiety disorder  Thing of depression nor anxiety with the reduction of Viibryd.  Consider further reduction.  Or elimination.    Cont Vyvanse DT better energy, mood, focus. Except reduce to 30 mg daily bc wired and insomnia.  It would seem unlikely the current dose of Vyvanse would be causing insomnia of the early morning awakening variety but it is possible  Options for sleep: Melatonin extended release 5-10 mg nightly. Valerian root 2 capsules at night. Hydroxyzine 25-50 mg nightly. Do not combine hydroxyzine and diphenhydramine (Benadryl). Option Belsomra prescription usually you need a week trial. 15 mg nightly. Gave 10 day trial Rx.  We are trying to avoid sleep meds that may contribute to weight gain  Sleep hygiene disc in detail including restriction, light, relaxation, etc.  This appt was 30 mins.  FU 64mos  Lynder Parents, MD, DFAPA    Please see After Visit Summary for patient specific instructions.  Future Appointments  Date Time Provider [place]  03/06/2019  3:00 PM Stefanie Libel, MD [facility]    No orders of the defined types were placed in this encounter.     -------------------------------

## 2019-02-15 DIAGNOSIS — J069 Acute upper respiratory infection, unspecified: Secondary | ICD-10-CM | POA: Diagnosis not present

## 2019-02-20 DIAGNOSIS — F411 Generalized anxiety disorder: Secondary | ICD-10-CM | POA: Diagnosis not present

## 2019-03-06 ENCOUNTER — Encounter: Payer: Self-pay | Admitting: Sports Medicine

## 2019-03-06 ENCOUNTER — Ambulatory Visit: Payer: BLUE CROSS/BLUE SHIELD | Admitting: Sports Medicine

## 2019-03-06 ENCOUNTER — Other Ambulatory Visit: Payer: Self-pay

## 2019-03-06 DIAGNOSIS — M898X1 Other specified disorders of bone, shoulder: Secondary | ICD-10-CM

## 2019-03-06 DIAGNOSIS — M79672 Pain in left foot: Secondary | ICD-10-CM

## 2019-03-06 DIAGNOSIS — M79671 Pain in right foot: Secondary | ICD-10-CM

## 2019-03-06 DIAGNOSIS — M4802 Spinal stenosis, cervical region: Secondary | ICD-10-CM

## 2019-03-06 NOTE — Assessment & Plan Note (Signed)
This seems stable  We will plan continued conservative care  We reviewed and modified his HEP

## 2019-03-06 NOTE — Assessment & Plan Note (Signed)
Cont. To control this with HEP and w ibuprofen Stop MM relaxants  Modify lifts that are overhead or stress + left shoulder  Reduce weight for left

## 2019-03-06 NOTE — Progress Notes (Signed)
CC: Cervical spinal stenosis  On last visit had periscapular spasm and some tightness into left shoulder Trial of Robaxin - no real relief Continued HEP and weight work at gym Lost more weight In general still with some pain but not limited Good response to ibuprofen Does feel that left shoulder is somewhat weaker  ROS No radiation or radiculopathy into arms No weakness of grip or UE Pain locates to posterior left shoulder Wants to try some insoles for arch and bilat. Foot pain  PE Strong W M in NAD BP 122/82   Ht 6' 1.5" (1.867 m)   Wt 237 lb (107.5 kg)   BMI 30.84 kg/m   Shoulder: Left Inspection reveals no abnormalities, atrophy or asymmetry. Palpation is normal with no tenderness over AC joint or bicipital groove. ROM is full in all planes. Rotator cuff strength normal throughout. He does get some pain with ER testing but is strong No signs of impingement with negative Neer and Hawkin's tests, empty can. Speeds and Yergason's tests normal. No labral pathology noted with negative Obrien's, negative clunk and good stability. Normal scapular function observed but on left has a slight scapular "shimmy". No painful arc and no drop arm sign. No apprehension sign  Neck motion is good with no pain

## 2019-03-07 ENCOUNTER — Other Ambulatory Visit: Payer: Self-pay

## 2019-03-07 ENCOUNTER — Telehealth: Payer: Self-pay | Admitting: Psychiatry

## 2019-03-07 MED ORDER — LISDEXAMFETAMINE DIMESYLATE 30 MG PO CAPS: 30.0000 mg | ORAL_CAPSULE | Freq: Every day | ORAL | 0 refills | Status: DC

## 2019-03-07 NOTE — Telephone Encounter (Signed)
Patient called and said that he needs a refill on his vayvanse 30 mg to be [facility] [address]

## 2019-03-07 NOTE — Telephone Encounter (Signed)
Last fill 02/12/2019 #30 will submit to provider for approval

## 2019-03-07 NOTE — Telephone Encounter (Signed)
rx submitted to provider for approval

## 2019-03-12 DIAGNOSIS — F411 Generalized anxiety disorder: Secondary | ICD-10-CM | POA: Diagnosis not present

## 2019-03-19 DIAGNOSIS — R7303 Prediabetes: Secondary | ICD-10-CM | POA: Diagnosis not present

## 2019-03-19 DIAGNOSIS — F5101 Primary insomnia: Secondary | ICD-10-CM | POA: Diagnosis not present

## 2019-03-19 DIAGNOSIS — E782 Mixed hyperlipidemia: Secondary | ICD-10-CM | POA: Diagnosis not present

## 2019-03-19 DIAGNOSIS — E039 Hypothyroidism, unspecified: Secondary | ICD-10-CM | POA: Diagnosis not present

## 2019-03-26 ENCOUNTER — Other Ambulatory Visit: Payer: Self-pay | Admitting: Psychiatry

## 2019-03-26 ENCOUNTER — Telehealth: Payer: Self-pay | Admitting: Psychiatry

## 2019-03-26 MED ORDER — LISDEXAMFETAMINE DIMESYLATE 40 MG PO CAPS: 40.0000 mg | ORAL_CAPSULE | Freq: Every day | ORAL | 0 refills | Status: DC

## 2019-03-26 NOTE — Progress Notes (Signed)
Okay change Vyvanse 40 mg daily per patient request

## 2019-03-26 NOTE — Telephone Encounter (Signed)
Pt called currently taking Vyvance 30mg  seems not quite enough. Vyvance 50mg  a little much, too jittery. Would you consider Vyvanse 40mg ? [facility] [address] Same pharm as before

## 2019-03-26 NOTE — Telephone Encounter (Signed)
Agree to adjustment Vyvanse 40 mg daily.  Prescription sent

## 2019-04-15 ENCOUNTER — Other Ambulatory Visit: Payer: Self-pay

## 2019-04-15 ENCOUNTER — Encounter: Payer: Self-pay | Admitting: Psychiatry

## 2019-04-15 ENCOUNTER — Ambulatory Visit (INDEPENDENT_AMBULATORY_CARE_PROVIDER_SITE_OTHER): Payer: BLUE CROSS/BLUE SHIELD | Admitting: Psychiatry

## 2019-04-15 DIAGNOSIS — F5105 Insomnia due to other mental disorder: Secondary | ICD-10-CM

## 2019-04-15 DIAGNOSIS — F09 Unspecified mental disorder due to known physiological condition: Secondary | ICD-10-CM

## 2019-04-15 DIAGNOSIS — F411 Generalized anxiety disorder: Secondary | ICD-10-CM

## 2019-04-15 DIAGNOSIS — F900 Attention-deficit hyperactivity disorder, predominantly inattentive type: Secondary | ICD-10-CM

## 2019-04-15 DIAGNOSIS — F5081 Binge eating disorder: Secondary | ICD-10-CM | POA: Diagnosis not present

## 2019-04-15 MED ORDER — LISDEXAMFETAMINE DIMESYLATE 40 MG PO CAPS: 40.0000 mg | ORAL_CAPSULE | ORAL | 0 refills | Status: DC

## 2019-04-15 MED ORDER — L-METHYLFOLATE CALCIUM 15 MG PO TABS: 1.0000 | ORAL_TABLET | Freq: Every day | ORAL | 3 refills | Status: DC

## 2019-04-15 MED ORDER — LISDEXAMFETAMINE DIMESYLATE 40 MG PO CAPS: 40.0000 mg | ORAL_CAPSULE | Freq: Every day | ORAL | 0 refills | Status: DC

## 2019-04-15 NOTE — Progress Notes (Signed)
Joshua Pearson [id number] 06/27/1966 53 y.o.  Subjective:   Patient ID:  Joshua Pearson is a 53 y.o. (DOB [birth year]) male.  Chief Complaint:  Chief Complaint  Patient presents with  . Follow-up    Medication Management    HPI Joshua Pearson presents to the office today for follow-up of conc and mood is better with the change from Provigil to Vyvanse.    Last seen February 12, 2018.  He was having trouble with insomnia and Belsomra trial was initiated at 15 mg nightly.  Belsomra didn't do anything for sleep.  Has experimented with Valerian root 2 and 10 mg melatonin and 10 mg hydroxyzine seems to help.  Awakens after an hour and hungry for salt and fall back to sleep.  Viiibryd at 10 mg for awhile.  Tried Vyvanse and too jittery at 60 mg and 50.  30 mg too low.  40 mg seems the perfect number bc can sleep 7 hours.  Energy good daily.  Exercise at home.  Started at 307# and now down 70# via [facility].  Wants tor try stop Viibryd eventually.    Patient reports stable mood and denies depressed but slight irritable moods.  Patient denies any recent difficulty with anxiety.  Patient denies difficulty with sleep initiation or maintenance. Denies appetite disturbance.  Patient reports that energy and motivation have been good.  Patient denies any difficulty with concentration.  Patient denies any suicidal ideation.  He has fairly recently stopped buspirone, and mirtazapine.  At prior  visit we reduced Viibryd to 10 mg daily.  He is trying to wean off some of the medications.  He wondered if the Viibryd can to contributed to weight gain.  Past psych meds:  NAC NR, Cerefolin NAC, trazodone NR, mirtazapine, modafinil, Viibryd 20 mg, Deplin, Paxil CR 12.5, duloxetine, Lexapro, Xanax XR  Review of Systems:  Review of Systems  Neurological: Negative for tremors and weakness.  Psychiatric/Behavioral: Negative for agitation, behavioral problems, confusion, decreased concentration,  dysphoric mood, hallucinations, self-injury, sleep disturbance and suicidal ideas. The patient is not nervous/anxious and is not hyperactive.     Medications: I have reviewed the patient's current medications.  Current Outpatient Medications  Medication Sig Dispense Refill  . Cholecalciferol (D 5000) 5000 units TABS Take by mouth.    . DENTA 5000 PLUS 1.1 % CREA dental cream     . L-methylfolate Calcium 15 MG TABS Take 1 tablet by mouth daily. 90 tablet 3  . lansoprazole (PREVACID 24HR) 15 MG capsule Take by mouth.    . Levetiracetam (KEPPRA XR) 750 MG TB24 Take by mouth.    . levothyroxine (SYNTHROID, LEVOTHROID) 75 MCG tablet     . lisdexamfetamine (VYVANSE) 40 MG capsule Take 1 capsule (40 mg total) by mouth daily. 30 capsule 0  . loratadine (CLARITIN) 10 MG tablet Take by mouth.    . Magnesium 500 MG CAPS Take 500 mg by mouth.    . Melatonin 5 MG CAPS Take 10 mg by mouth.     . naproxen sodium (ALEVE) 220 MG tablet Take by mouth.    . omega-3 acid ethyl esters (LOVAZA) 1 g capsule Take by mouth.    . Phosphatidylserine 100 MG CAPS Take 1 capsule by mouth daily.    Marland Kitchen testosterone cypionate (DEPOTESTOSTERONE CYPIONATE) 200 MG/ML injection Inject into the muscle. Inject into muscle every 14 days    . Vilazodone HCl (VIIBRYD) 20 MG TABS Take 1 tablet (20 mg total) by  mouth daily. (Patient taking differently: Take 10 mg by mouth daily. ) 90 tablet 1  . vitamin B-12 (CYANOCOBALAMIN) 500 MCG tablet Take 5,000 mcg by mouth daily.    . hydrOXYzine (ATARAX/VISTARIL) 10 MG tablet Take by mouth.    . Ibuprofen-diphenhydrAMINE Cit (MOTRIN PM) 200-38 MG TABS Take by mouth.    Derrill Memo ON 05/13/2019] lisdexamfetamine (VYVANSE) 40 MG capsule Take 1 capsule (40 mg total) by mouth every morning. 30 capsule 0  . [START ON 06/10/2019] lisdexamfetamine (VYVANSE) 40 MG capsule Take 1 capsule (40 mg total) by mouth every morning. 30 capsule 0   No current facility-administered medications for this visit.      Medication Side Effects: None  Allergies:  Allergies  Allergen Reactions  . Doxycycline Anaphylaxis  . Aspirin Other (See Comments)    dizziness    History reviewed. No pertinent past medical history.  History reviewed. No pertinent family history.  Social History   Socioeconomic History  . Marital status: Married    Spouse name: Not on file  . Number of children: Not on file  . Years of education: Not on file  . Highest education level: Not on file  Occupational History  . Not on file  Social Needs  . Financial resource strain: Not on file  . Food insecurity:    Worry: Not on file    Inability: Not on file  . Transportation needs:    Medical: Not on file    Non-medical: Not on file  Tobacco Use  . Smoking status: Never Smoker  . Smokeless tobacco: Never Used  Substance and Sexual Activity  . Alcohol use: Not on file  . Drug use: Not on file  . Sexual activity: Not on file  Lifestyle  . Physical activity:    Days per week: Not on file    Minutes per session: Not on file  . Stress: Not on file  Relationships  . Social connections:    Talks on phone: Not on file    Gets together: Not on file    Attends religious service: Not on file    Active member of club or organization: Not on file    Attends meetings of clubs or organizations: Not on file    Relationship status: Not on file  . Intimate partner violence:    Fear of current or ex partner: Not on file    Emotionally abused: Not on file    Physically abused: Not on file    Forced sexual activity: Not on file  Other Topics Concern  . Not on file  Social History Narrative  . Not on file    Past Medical History, Surgical history, Social history, and Family history were reviewed and updated as appropriate.   Please see review of systems for further details on the patient's review from today.   Objective:   Physical Exam:  There were no vitals taken for this visit.  Physical Exam Neurological:      Mental Status: He is alert and oriented to person, place, and time.     Cranial Nerves: No dysarthria.  Psychiatric:        Attention and Perception: Attention normal.        Mood and Affect: Mood normal.        Speech: Speech normal.        Behavior: Behavior is cooperative.        Thought Content: Thought content normal. Thought content is not paranoid or delusional. Thought  content does not include homicidal or suicidal ideation. Thought content does not include homicidal or suicidal plan.        Cognition and Memory: Cognition and memory normal.        Judgment: Judgment normal.     Lab Review:  No results found for: NA, K, CL, CO2, GLUCOSE, BUN, CREATININE, CALCIUM, PROT, ALBUMIN, AST, ALT, ALKPHOS, BILITOT, GFRNONAA, GFRAA  No results found for: WBC, RBC, HGB, HCT, PLT, MCV, MCH, MCHC, RDW, LYMPHSABS, MONOABS, EOSABS, BASOSABS  No results found for: POCLITH, LITHIUM   No results found for: PHENYTOIN, PHENOBARB, VALPROATE, CBMZ   .res Assessment: Plan:    Extreme binge-eating disorder  Attention deficit hyperactivity disorder (ADHD), predominantly inattentive type  Insomnia due to mental condition  Generalized anxiety disorder  Organic brain syndrome (chronic)  Joshua Pearson has a history of brain surgery and seizure disorder which has left him with some neurocognitive deficits that are mild.  We have managed them with the various medications including recently the Vyvanse.  He is aware of seizure risk.  He is generally medication sensitive.  He is slowly losing weight through [facility] and the use of the Vyvanse.  No significant depression nor anxiety with the reduction of Viibryd.  Consider further reduction.  Or elimination.  But now is a stressful time due to the Covid virus and we will defer.   Cont Vyvanse DT better energy, mood, focus, and control of binge eating he is still doing some binge eating but only slightly after he falls asleep he  awakens.  We discussed managing that..  He has experimented with various dosages and feels that 40 mg is generally the ideal dose in terms of tolerability and effectiveness.  His sleep is improved with the supplements and this particular dose of the Viibryd.     Options for sleep: Melatonin extended release 5-10 mg nightly. Valerian root 2 capsules at night. Hydroxyzine 25-50 mg nightly. Do not combine hydroxyzine and diphenhydramine (Benadryl). Belsomra did not seem to work..  We are trying to avoid sleep meds that may contribute to weight gain  Sleep hygiene disc in detail including restriction, light, relaxation, etc.  This appt was 30 mins.  FU 4 mos  I connected with patient by a video enabled telemedicine application or telephone, with their informed consent, and verified patient privacy and that I am speaking with the correct person using two identifiers.  I was located at office and patient at home.  Lynder Parents, MD, DFAPA    Please see After Visit Summary for patient specific instructions.  No future appointments.  No orders of the defined types were placed in this encounter.     -------------------------------

## 2019-04-17 ENCOUNTER — Telehealth: Payer: Self-pay | Admitting: Psychiatry

## 2019-04-17 ENCOUNTER — Other Ambulatory Visit: Payer: Self-pay

## 2019-04-17 MED ORDER — L-METHYLFOLATE CALCIUM 15 MG PO TABS: 1.0000 | ORAL_TABLET | Freq: Every day | ORAL | 3 refills | Status: DC

## 2019-04-17 NOTE — Telephone Encounter (Signed)
Patient called and said that the mythylfolate calicum 15 mg is not covered by insurance and wants it sent to [facility] on [address]. A 90 day supply is less expensive there. So cancel or transfer the script at [facility]

## 2019-04-17 NOTE — Telephone Encounter (Signed)
Submitted to [employer]

## 2019-04-18 DIAGNOSIS — E786 Lipoprotein deficiency: Secondary | ICD-10-CM | POA: Diagnosis not present

## 2019-04-18 DIAGNOSIS — E781 Pure hyperglyceridemia: Secondary | ICD-10-CM | POA: Diagnosis not present

## 2019-04-18 DIAGNOSIS — G4733 Obstructive sleep apnea (adult) (pediatric): Secondary | ICD-10-CM | POA: Diagnosis not present

## 2019-04-18 DIAGNOSIS — E669 Obesity, unspecified: Secondary | ICD-10-CM | POA: Diagnosis not present

## 2019-04-22 ENCOUNTER — Other Ambulatory Visit: Payer: Self-pay

## 2019-04-22 ENCOUNTER — Telehealth: Payer: Self-pay | Admitting: Psychiatry

## 2019-04-22 MED ORDER — LORAZEPAM 0.5 MG PO TABS: 0.5000 mg | ORAL_TABLET | Freq: Every day | ORAL | 0 refills | Status: DC | PRN

## 2019-04-22 NOTE — Telephone Encounter (Signed)
Pt called request new Rx for Lorazepam. Stated he has not filled in a few months. [facility] [address]

## 2019-04-22 NOTE — Telephone Encounter (Signed)
Last fill 04/2018 #45 will submit for refill

## 2019-05-12 DIAGNOSIS — E559 Vitamin D deficiency, unspecified: Secondary | ICD-10-CM | POA: Diagnosis not present

## 2019-05-12 DIAGNOSIS — E291 Testicular hypofunction: Secondary | ICD-10-CM | POA: Diagnosis not present

## 2019-05-12 DIAGNOSIS — E782 Mixed hyperlipidemia: Secondary | ICD-10-CM | POA: Diagnosis not present

## 2019-05-12 DIAGNOSIS — E538 Deficiency of other specified B group vitamins: Secondary | ICD-10-CM | POA: Diagnosis not present

## 2019-05-12 DIAGNOSIS — Z125 Encounter for screening for malignant neoplasm of prostate: Secondary | ICD-10-CM | POA: Diagnosis not present

## 2019-05-12 DIAGNOSIS — R7303 Prediabetes: Secondary | ICD-10-CM | POA: Diagnosis not present

## 2019-06-11 DIAGNOSIS — E781 Pure hyperglyceridemia: Secondary | ICD-10-CM | POA: Diagnosis not present

## 2019-06-11 DIAGNOSIS — G4733 Obstructive sleep apnea (adult) (pediatric): Secondary | ICD-10-CM | POA: Diagnosis not present

## 2019-06-11 DIAGNOSIS — E786 Lipoprotein deficiency: Secondary | ICD-10-CM | POA: Diagnosis not present

## 2019-06-11 DIAGNOSIS — Z9989 Dependence on other enabling machines and devices: Secondary | ICD-10-CM | POA: Diagnosis not present

## 2019-06-16 DIAGNOSIS — E291 Testicular hypofunction: Secondary | ICD-10-CM | POA: Diagnosis not present

## 2019-06-23 ENCOUNTER — Other Ambulatory Visit: Payer: Self-pay | Admitting: Psychiatry

## 2019-06-23 ENCOUNTER — Telehealth: Payer: Self-pay | Admitting: Psychiatry

## 2019-06-23 DIAGNOSIS — E291 Testicular hypofunction: Secondary | ICD-10-CM | POA: Diagnosis not present

## 2019-06-23 MED ORDER — LISDEXAMFETAMINE DIMESYLATE 40 MG PO CAPS: 40.0000 mg | ORAL_CAPSULE | Freq: Every day | ORAL | 0 refills | Status: DC

## 2019-06-23 NOTE — Telephone Encounter (Signed)
This was done.

## 2019-06-23 NOTE — Telephone Encounter (Signed)
Pt called pharmacy trying to get a refill on Vyvanse 40mg . They said they can't fill until July 3rd unless Dr. Clovis Pu calls and ok an early refill. Pt is leaving for vacation on July 2nd.

## 2019-06-25 DIAGNOSIS — E291 Testicular hypofunction: Secondary | ICD-10-CM | POA: Diagnosis not present

## 2019-06-25 DIAGNOSIS — N138 Other obstructive and reflux uropathy: Secondary | ICD-10-CM | POA: Diagnosis not present

## 2019-06-25 DIAGNOSIS — N401 Enlarged prostate with lower urinary tract symptoms: Secondary | ICD-10-CM | POA: Diagnosis not present

## 2019-07-07 DIAGNOSIS — Z6832 Body mass index (BMI) 32.0-32.9, adult: Secondary | ICD-10-CM | POA: Diagnosis not present

## 2019-07-07 DIAGNOSIS — Z713 Dietary counseling and surveillance: Secondary | ICD-10-CM | POA: Diagnosis not present

## 2019-07-07 DIAGNOSIS — E669 Obesity, unspecified: Secondary | ICD-10-CM | POA: Diagnosis not present

## 2019-07-25 DIAGNOSIS — F4320 Adjustment disorder, unspecified: Secondary | ICD-10-CM | POA: Diagnosis not present

## 2019-08-01 DIAGNOSIS — F4322 Adjustment disorder with anxiety: Secondary | ICD-10-CM | POA: Diagnosis not present

## 2019-08-05 DIAGNOSIS — H10413 Chronic giant papillary conjunctivitis, bilateral: Secondary | ICD-10-CM | POA: Diagnosis not present

## 2019-08-05 DIAGNOSIS — H04123 Dry eye syndrome of bilateral lacrimal glands: Secondary | ICD-10-CM | POA: Diagnosis not present

## 2019-08-05 DIAGNOSIS — H5203 Hypermetropia, bilateral: Secondary | ICD-10-CM | POA: Diagnosis not present

## 2019-08-06 DIAGNOSIS — G4733 Obstructive sleep apnea (adult) (pediatric): Secondary | ICD-10-CM | POA: Diagnosis not present

## 2019-08-06 DIAGNOSIS — E786 Lipoprotein deficiency: Secondary | ICD-10-CM | POA: Diagnosis not present

## 2019-08-06 DIAGNOSIS — E781 Pure hyperglyceridemia: Secondary | ICD-10-CM | POA: Diagnosis not present

## 2019-08-06 DIAGNOSIS — Z6830 Body mass index (BMI) 30.0-30.9, adult: Secondary | ICD-10-CM | POA: Diagnosis not present

## 2019-08-08 DIAGNOSIS — F4322 Adjustment disorder with anxiety: Secondary | ICD-10-CM | POA: Diagnosis not present

## 2019-08-13 DIAGNOSIS — F4322 Adjustment disorder with anxiety: Secondary | ICD-10-CM | POA: Diagnosis not present

## 2019-08-15 ENCOUNTER — Ambulatory Visit: Payer: BLUE CROSS/BLUE SHIELD | Admitting: Psychiatry

## 2019-08-22 DIAGNOSIS — F4322 Adjustment disorder with anxiety: Secondary | ICD-10-CM | POA: Diagnosis not present

## 2019-08-28 ENCOUNTER — Other Ambulatory Visit: Payer: Self-pay

## 2019-08-28 ENCOUNTER — Encounter: Payer: Self-pay | Admitting: Psychiatry

## 2019-08-28 ENCOUNTER — Ambulatory Visit (INDEPENDENT_AMBULATORY_CARE_PROVIDER_SITE_OTHER): Payer: BC Managed Care – PPO | Admitting: Psychiatry

## 2019-08-28 VITALS — BP 140/84 | HR 79

## 2019-08-28 DIAGNOSIS — F900 Attention-deficit hyperactivity disorder, predominantly inattentive type: Secondary | ICD-10-CM

## 2019-08-28 DIAGNOSIS — G4733 Obstructive sleep apnea (adult) (pediatric): Secondary | ICD-10-CM

## 2019-08-28 DIAGNOSIS — F5105 Insomnia due to other mental disorder: Secondary | ICD-10-CM

## 2019-08-28 DIAGNOSIS — E291 Testicular hypofunction: Secondary | ICD-10-CM | POA: Diagnosis not present

## 2019-08-28 DIAGNOSIS — F411 Generalized anxiety disorder: Secondary | ICD-10-CM

## 2019-08-28 DIAGNOSIS — F09 Unspecified mental disorder due to known physiological condition: Secondary | ICD-10-CM | POA: Diagnosis not present

## 2019-08-28 DIAGNOSIS — F5081 Binge eating disorder: Secondary | ICD-10-CM | POA: Diagnosis not present

## 2019-08-28 MED ORDER — LISDEXAMFETAMINE DIMESYLATE 40 MG PO CAPS: 40.0000 mg | ORAL_CAPSULE | ORAL | 0 refills | Status: DC

## 2019-08-28 MED ORDER — LISDEXAMFETAMINE DIMESYLATE 40 MG PO CAPS: 40.0000 mg | ORAL_CAPSULE | Freq: Every day | ORAL | 0 refills | Status: DC

## 2019-08-28 NOTE — Progress Notes (Signed)
RUDOLPH SELIGER SN:[id number] 08-25-1966 53 y.o.  Subjective:   Patient ID:  Joshua Pearson is a 53 y.o. (DOB [birth year]) male.  Chief Complaint:  Chief Complaint  Patient presents with  . Follow-up    Medication Management  . Anxiety    Medication Management  . ADD    Anxiety Patient reports no confusion, decreased concentration, nervous/anxious behavior or suicidal ideas.     Joshua Pearson presents to the office today for follow-up of conc and mood is better with the change from Provigil to Vyvanse.    When seen February 12, 2018.  He was having trouble with insomnia and Belsomra trial was initiated at 15 mg nightly.  Belsomra didn't do anything for sleep.  And follow-up April 15, 2019 he was still having sleep issues and we discussed a variety of over-the-counter treatments he could consider.   Quite well overall.  Lost another 15# or so and a goal of another 20#.  Focus pretty good with Vyvanse.  Major concerns remain the sleep not good.    Taking 2 Valerian Root, 10 mg SR melatonin and awaken every 2-3 hours but quickly back to sleep in 5-15 mins.  Total hours 6- 6 1/2 hours.  Not drowsy nor tired with Vyvanse and B12 helping.  Hydroxyzine helped a little with some hangover and thinks SE ED.  Limited benefit  Work OK and the same with limitations.  Work 5-6 hours with breaks.  Managing the limitations from prior CNS surgery complications.  Viiibryd at 10 mg for awhile. bc can sleep 7 hours.  Energy good daily.  Exercise at home.  Started at 307# with marked weight loss via [facility].  Wants tor try stop Viibryd eventually.    Patient reports stable mood and denies depressed but slight irritable moods.  Patient denies any recent difficulty with anxiety.  Patient denies difficulty with sleep initiation or maintenance. Denies appetite disturbance.  Patient reports that energy and motivation have been good.  Patient denies any difficulty with concentration.  Patient  denies any suicidal ideation.  He has fairly recently stopped buspirone, and mirtazapine.  At prior  visit we reduced Viibryd to 10 mg daily.  He is trying to wean off some of the medications.  He wondered if the Viibryd can to contributed to weight gain.  Past psych meds:  NAC NR, Cerefolin NAC, trazodone NR, mirtazapine, modafinil, Viibryd 20 mg, Deplin, Paxil CR 12.5, duloxetine, Lexapro, Xanax XR, Belsomra NR, hydroxyzine limited effect  Tried Vyvanse and too jittery at 60 mg and 50.  30 mg too low.  40 mg seems the perfect number   Review of Systems:  Review of Systems  Neurological: Negative for tremors and weakness.  Psychiatric/Behavioral: Negative for agitation, behavioral problems, confusion, decreased concentration, dysphoric mood, hallucinations, self-injury, sleep disturbance and suicidal ideas. The patient is not nervous/anxious and is not hyperactive.     Medications: I have reviewed the patient's current medications.  Current Outpatient Medications  Medication Sig Dispense Refill  . Cholecalciferol (D 5000) 5000 units TABS Take by mouth.    . DENTA 5000 PLUS 1.1 % CREA dental cream     . L-methylfolate Calcium 15 MG TABS Take 1 tablet by mouth daily. 90 tablet 3  . lansoprazole (PREVACID 24HR) 15 MG capsule Take by mouth.    . Levetiracetam (KEPPRA XR) 750 MG TB24 Take by mouth.    . levothyroxine (SYNTHROID, LEVOTHROID) 75 MCG tablet     . lisdexamfetamine (  VYVANSE) 40 MG capsule Take 1 capsule (40 mg total) by mouth every morning. 30 capsule 0  . [START ON 09/25/2019] lisdexamfetamine (VYVANSE) 40 MG capsule Take 1 capsule (40 mg total) by mouth every morning. 30 capsule 0  . [START ON 10/23/2019] lisdexamfetamine (VYVANSE) 40 MG capsule Take 1 capsule (40 mg total) by mouth daily. 30 capsule 0  . loratadine (CLARITIN) 10 MG tablet Take by mouth.    Marland Kitchen LORazepam (ATIVAN) 0.5 MG tablet Take 1 tablet (0.5 mg total) by mouth daily as needed for anxiety. 30 tablet 0  .  Magnesium 500 MG CAPS Take 500 mg by mouth.    . Melatonin 5 MG CAPS Take 10 mg by mouth.     . naproxen sodium (ALEVE) 220 MG tablet Take by mouth.    . omega-3 acid ethyl esters (LOVAZA) 1 g capsule Take by mouth.    . Phosphatidylserine 100 MG CAPS Take 1 capsule by mouth daily.    Marland Kitchen testosterone cypionate (DEPOTESTOSTERONE CYPIONATE) 200 MG/ML injection Inject into the muscle. Inject into muscle every 14 days    . Vilazodone HCl (VIIBRYD) 20 MG TABS Take 1 tablet (20 mg total) by mouth daily. (Patient taking differently: Take 10 mg by mouth daily. ) 90 tablet 1  . vitamin B-12 (CYANOCOBALAMIN) 500 MCG tablet Take 5,000 mcg by mouth daily.     No current facility-administered medications for this visit.     Medication Side Effects: None  Allergies:  Allergies  Allergen Reactions  . Doxycycline Anaphylaxis  . Aspirin Other (See Comments)    dizziness    History reviewed. No pertinent past medical history.  History reviewed. No pertinent family history.  Social History   Socioeconomic History  . Marital status: Married    Spouse name: Not on file  . Number of children: Not on file  . Years of education: Not on file  . Highest education level: Not on file  Occupational History  . Not on file  Social Needs  . Financial resource strain: Not on file  . Food insecurity    Worry: Not on file    Inability: Not on file  . Transportation needs    Medical: Not on file    Non-medical: Not on file  Tobacco Use  . Smoking status: Never Smoker  . Smokeless tobacco: Never Used  Substance and Sexual Activity  . Alcohol use: Not on file  . Drug use: Not on file  . Sexual activity: Not on file  Lifestyle  . Physical activity    Days per week: Not on file    Minutes per session: Not on file  . Stress: Not on file  Relationships  . Social Herbalist on phone: Not on file    Gets together: Not on file    Attends religious service: Not on file    Active member of  club or organization: Not on file    Attends meetings of clubs or organizations: Not on file    Relationship status: Not on file  . Intimate partner violence    Fear of current or ex partner: Not on file    Emotionally abused: Not on file    Physically abused: Not on file    Forced sexual activity: Not on file  Other Topics Concern  . Not on file  Social History Narrative  . Not on file    Past Medical History, Surgical history, Social history, and Family history were reviewed and  updated as appropriate.   Please see review of systems for further details on the patient's review from today.   Objective:   Physical Exam:  BP 140/84   Pulse 79   Physical Exam Neurological:     Mental Status: He is alert and oriented to person, place, and time.     Cranial Nerves: No dysarthria.  Psychiatric:        Attention and Perception: Attention normal.        Mood and Affect: Mood normal.        Speech: Speech normal.        Behavior: Behavior is cooperative.        Thought Content: Thought content normal. Thought content is not paranoid or delusional. Thought content does not include homicidal or suicidal ideation. Thought content does not include homicidal or suicidal plan.        Cognition and Memory: Cognition and memory normal.        Judgment: Judgment normal.     Lab Review:  No results found for: NA, K, CL, CO2, GLUCOSE, BUN, CREATININE, CALCIUM, PROT, ALBUMIN, AST, ALT, ALKPHOS, BILITOT, GFRNONAA, GFRAA  No results found for: WBC, RBC, HGB, HCT, PLT, MCV, MCH, MCHC, RDW, LYMPHSABS, MONOABS, EOSABS, BASOSABS  No results found for: POCLITH, LITHIUM   No results found for: PHENYTOIN, PHENOBARB, VALPROATE, CBMZ   .res Assessment: Plan:    Attention deficit hyperactivity disorder (ADHD), predominantly inattentive type - Plan: lisdexamfetamine (VYVANSE) 40 MG capsule, lisdexamfetamine (VYVANSE) 40 MG capsule, lisdexamfetamine (VYVANSE) 40 MG capsule  Generalized anxiety  disorder  Organic brain syndrome (chronic)  Binge eating disorder  Insomnia due to mental condition  Obstructive sleep apnea  Joshua Pearson has a history of brain surgery and seizure disorder which has left him with some neurocognitive deficits that are mild.  We have managed them with the various medications including recently the Vyvanse.  He is aware of seizure risk.  He is generally medication sensitive.  Optimized Vyvanse dose with good effect for ADD and BED.  He is [facility] and the use of the Vyvanse.  No significant depression nor anxiety at 10 mg Viibryd.  Consider further reduction.  Or elimination.  But now is a stressful time due to the Covid virus and we will defer.  Educated about normal sleep and brief awakenings are not particularly harmful.  Explained stages of sleep in detail.  hes's waking up between stages which is bothersome but not causing daytime complications.  Options for sleep: Melatonin extended release 5-10 mg nightly. Valerian root 2 capsules at night. Trying to avoid memory impairing sleep meds.  Disc CPAP and weight loss.  Explained the way automatic machines work .Has lost 70#.  Disc risks if OSA still present and untreated without CPAP and options less invasive mask  Options. We are trying to avoid sleep meds that may contribute to weight gain  Sleep hygiene disc in detail including restriction, light, relaxation, etc.  Greater than 50% of 30 mins.face to face time with patient was spent on counseling and coordination of care.   FU 4-6 mos  Temple Sporer Clovis Pu, MD, DFAPA    Please see After Visit Summary for patient specific instructions.  Future Appointments  Date Time Provider [place]  02/27/2020  9:00 AM Cottle, Billey Co., MD CP-CP None    No orders of the defined types were placed in this encounter.     -------------------------------

## 2019-08-29 DIAGNOSIS — F4322 Adjustment disorder with anxiety: Secondary | ICD-10-CM | POA: Diagnosis not present

## 2019-09-12 DIAGNOSIS — F4322 Adjustment disorder with anxiety: Secondary | ICD-10-CM | POA: Diagnosis not present

## 2019-09-24 DIAGNOSIS — F4322 Adjustment disorder with anxiety: Secondary | ICD-10-CM | POA: Diagnosis not present

## 2019-09-26 DIAGNOSIS — R635 Abnormal weight gain: Secondary | ICD-10-CM | POA: Diagnosis not present

## 2019-09-26 DIAGNOSIS — R419 Unspecified symptoms and signs involving cognitive functions and awareness: Secondary | ICD-10-CM | POA: Diagnosis not present

## 2019-10-07 ENCOUNTER — Other Ambulatory Visit: Payer: Self-pay

## 2019-10-07 ENCOUNTER — Ambulatory Visit: Payer: BC Managed Care – PPO | Admitting: Sports Medicine

## 2019-10-07 ENCOUNTER — Encounter: Payer: Self-pay | Admitting: Sports Medicine

## 2019-10-07 DIAGNOSIS — R7989 Other specified abnormal findings of blood chemistry: Secondary | ICD-10-CM | POA: Diagnosis not present

## 2019-10-07 DIAGNOSIS — E038 Other specified hypothyroidism: Secondary | ICD-10-CM | POA: Diagnosis not present

## 2019-10-07 DIAGNOSIS — E785 Hyperlipidemia, unspecified: Secondary | ICD-10-CM | POA: Diagnosis not present

## 2019-10-07 DIAGNOSIS — E538 Deficiency of other specified B group vitamins: Secondary | ICD-10-CM | POA: Diagnosis not present

## 2019-10-07 DIAGNOSIS — M4802 Spinal stenosis, cervical region: Secondary | ICD-10-CM | POA: Diagnosis not present

## 2019-10-07 NOTE — Progress Notes (Signed)
F/U of neck and shoulder blade pain  Hx of microdiscectomy C6/7 fusion At C5/6 had a bulging disc and narrowed areas measuring 9.5 MM  WE modified his exercises and he has benn able to continue weight lifting No overhead lifting  Able to RT MTN biking with only mild pain  Tried RT tennis and this causes moderate pain that increased by 4 lessons He was hitting overheads and serves This pain moved into RT shoulder blade Has had weakness of RT triceps that started in 2017  Comes for suggestions  Past HX Obesity issues and has managed to lose 80 pounds! Genral anxiety disorder  ROS No numbness in hands or arms Less trapezius tightness  PE Strong appearing WM in NAD BP 132/82   Ht 6\' 1"  (1.854 m)   Wt 230 lb (104.3 kg)   BMI 30.34 kg/m   Neck No TTP Mild limitation in extension Normal flexion Good rotation and lateral bending  Mild weakness in RT triceps compared to left  C5 to T1 strength testing appears normal and symmetrical  MRI of 2018 reviewed and mild area of focal narrowing and disc bulge at C5/6

## 2019-10-07 NOTE — Assessment & Plan Note (Addendum)
History of fusion of C6/7 At C5/6 some bulging and some focal narrowing This confirms to his pain pattern Overall he has fluid on both sides of his spinal chord on MRI to suggest that his overall canal is OK  Plan Isometric series I and T stretches Modified sports activity  I suggested trying a Pilates assessment with PT pilates I think he could potentially get back to tennis and other activities he enjoys if he learns to protect neck Does consistent isometric strength work  Reck pending response

## 2019-10-07 NOTE — Patient Instructions (Signed)
Isometric neck exercises Forward  Back  Left and right side 5 resistance x count of 10 - no movement  Trap swings Rapid 15 to 30 swings with low weight  Stretches T and I 3 repeats of each  Voltaren arthritis cream - use 2 to 3 times a day

## 2019-10-08 DIAGNOSIS — F4322 Adjustment disorder with anxiety: Secondary | ICD-10-CM | POA: Diagnosis not present

## 2019-10-13 DIAGNOSIS — F4322 Adjustment disorder with anxiety: Secondary | ICD-10-CM | POA: Diagnosis not present

## 2019-10-21 DIAGNOSIS — E038 Other specified hypothyroidism: Secondary | ICD-10-CM | POA: Diagnosis not present

## 2019-10-21 DIAGNOSIS — R7989 Other specified abnormal findings of blood chemistry: Secondary | ICD-10-CM | POA: Diagnosis not present

## 2019-10-21 DIAGNOSIS — E785 Hyperlipidemia, unspecified: Secondary | ICD-10-CM | POA: Diagnosis not present

## 2019-10-21 DIAGNOSIS — E538 Deficiency of other specified B group vitamins: Secondary | ICD-10-CM | POA: Diagnosis not present

## 2019-10-28 DIAGNOSIS — F4322 Adjustment disorder with anxiety: Secondary | ICD-10-CM | POA: Diagnosis not present

## 2019-11-18 DIAGNOSIS — Z6831 Body mass index (BMI) 31.0-31.9, adult: Secondary | ICD-10-CM | POA: Diagnosis not present

## 2019-11-18 DIAGNOSIS — E786 Lipoprotein deficiency: Secondary | ICD-10-CM | POA: Diagnosis not present

## 2019-11-18 DIAGNOSIS — E669 Obesity, unspecified: Secondary | ICD-10-CM | POA: Diagnosis not present

## 2019-11-25 ENCOUNTER — Telehealth: Payer: Self-pay | Admitting: Psychiatry

## 2019-11-25 DIAGNOSIS — F4322 Adjustment disorder with anxiety: Secondary | ICD-10-CM | POA: Diagnosis not present

## 2019-11-25 NOTE — Telephone Encounter (Signed)
Pt requesting a refill on his Vyvanse. Fill at the [facility] [address]. Next appt scheduled for 02/27/20.

## 2019-11-28 ENCOUNTER — Other Ambulatory Visit: Payer: Self-pay

## 2019-11-28 DIAGNOSIS — F900 Attention-deficit hyperactivity disorder, predominantly inattentive type: Secondary | ICD-10-CM

## 2019-11-28 MED ORDER — LISDEXAMFETAMINE DIMESYLATE 40 MG PO CAPS: 40.0000 mg | ORAL_CAPSULE | Freq: Every day | ORAL | 0 refills | Status: DC

## 2019-11-28 NOTE — Telephone Encounter (Signed)
Looks like message was accidentally submitted to provider, will pend for approval today

## 2019-11-28 NOTE — Telephone Encounter (Signed)
Joshua Pearson called again today requesting the refill of his vyvanse.  He is now out of the med.  Took last one this morning.  Please send in [facility] [address]

## 2019-12-01 DIAGNOSIS — D2372 Other benign neoplasm of skin of left lower limb, including hip: Secondary | ICD-10-CM | POA: Diagnosis not present

## 2019-12-01 DIAGNOSIS — L918 Other hypertrophic disorders of the skin: Secondary | ICD-10-CM | POA: Diagnosis not present

## 2019-12-01 DIAGNOSIS — D2262 Melanocytic nevi of left upper limb, including shoulder: Secondary | ICD-10-CM | POA: Diagnosis not present

## 2019-12-01 DIAGNOSIS — D2261 Melanocytic nevi of right upper limb, including shoulder: Secondary | ICD-10-CM | POA: Diagnosis not present

## 2019-12-01 DIAGNOSIS — D1801 Hemangioma of skin and subcutaneous tissue: Secondary | ICD-10-CM | POA: Diagnosis not present

## 2019-12-05 DIAGNOSIS — E291 Testicular hypofunction: Secondary | ICD-10-CM | POA: Diagnosis not present

## 2019-12-09 DIAGNOSIS — F4323 Adjustment disorder with mixed anxiety and depressed mood: Secondary | ICD-10-CM | POA: Diagnosis not present

## 2019-12-12 ENCOUNTER — Ambulatory Visit (INDEPENDENT_AMBULATORY_CARE_PROVIDER_SITE_OTHER): Payer: BC Managed Care – PPO | Admitting: Psychiatry

## 2019-12-12 ENCOUNTER — Encounter: Payer: Self-pay | Admitting: Psychiatry

## 2019-12-12 ENCOUNTER — Other Ambulatory Visit: Payer: Self-pay

## 2019-12-12 DIAGNOSIS — F5105 Insomnia due to other mental disorder: Secondary | ICD-10-CM

## 2019-12-12 DIAGNOSIS — G4733 Obstructive sleep apnea (adult) (pediatric): Secondary | ICD-10-CM

## 2019-12-12 DIAGNOSIS — F5081 Binge eating disorder: Secondary | ICD-10-CM

## 2019-12-12 DIAGNOSIS — F411 Generalized anxiety disorder: Secondary | ICD-10-CM | POA: Diagnosis not present

## 2019-12-12 DIAGNOSIS — F900 Attention-deficit hyperactivity disorder, predominantly inattentive type: Secondary | ICD-10-CM | POA: Diagnosis not present

## 2019-12-12 DIAGNOSIS — F09 Unspecified mental disorder due to known physiological condition: Secondary | ICD-10-CM

## 2019-12-12 DIAGNOSIS — F331 Major depressive disorder, recurrent, moderate: Secondary | ICD-10-CM
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
## 2019-12-12 NOTE — Patient Instructions (Signed)
Alternative to Viibryd is Trintellix for the potential added cognitive benefit.

## 2019-12-17 ENCOUNTER — Ambulatory Visit: Payer: BC Managed Care – PPO | Attending: Internal Medicine

## 2019-12-17 DIAGNOSIS — Z20822 Contact with and (suspected) exposure to covid-19: Secondary | ICD-10-CM

## 2019-12-17 DIAGNOSIS — Z20828 Contact with and (suspected) exposure to other viral communicable diseases: Secondary | ICD-10-CM | POA: Diagnosis not present

## 2019-12-18 LAB — NOVEL CORONAVIRUS, NAA: SARS-CoV-2, NAA: NOT DETECTED

## 2019-12-22 DIAGNOSIS — N62 Hypertrophy of breast: Secondary | ICD-10-CM | POA: Diagnosis not present

## 2019-12-22 DIAGNOSIS — E039 Hypothyroidism, unspecified: Secondary | ICD-10-CM | POA: Diagnosis not present

## 2019-12-22 DIAGNOSIS — R7303 Prediabetes: Secondary | ICD-10-CM | POA: Diagnosis not present

## 2019-12-22 DIAGNOSIS — E782 Mixed hyperlipidemia: Secondary | ICD-10-CM | POA: Diagnosis not present

## 2020-01-06 DIAGNOSIS — E786 Lipoprotein deficiency: Secondary | ICD-10-CM | POA: Diagnosis not present

## 2020-01-06 DIAGNOSIS — R7303 Prediabetes: Secondary | ICD-10-CM | POA: Diagnosis not present

## 2020-01-06 DIAGNOSIS — G4733 Obstructive sleep apnea (adult) (pediatric): Secondary | ICD-10-CM | POA: Diagnosis not present

## 2020-01-06 DIAGNOSIS — E669 Obesity, unspecified: Secondary | ICD-10-CM | POA: Diagnosis not present

## 2020-01-12 ENCOUNTER — Telehealth: Payer: Self-pay | Admitting: Psychiatry

## 2020-01-12 ENCOUNTER — Other Ambulatory Visit: Payer: Self-pay

## 2020-01-12 MED ORDER — VIIBRYD 10 MG PO TABS: 15.0000 mg | ORAL_TABLET | Freq: Every day | ORAL | 2 refills | Status: DC

## 2020-01-12 NOTE — Telephone Encounter (Signed)
15...Correction. Sorry

## 2020-01-12 NOTE — Telephone Encounter (Signed)
Pt requesting a refill on his Viibryd but would like to clarify dosage changes before you refill. Next appt scheduled for 3/5.

## 2020-01-12 NOTE — Telephone Encounter (Signed)
Currently taking 115 Mg .Marland KitchenMarland KitchenCorrection

## 2020-01-12 NOTE — Telephone Encounter (Signed)
Rx for Viibryd 10 mg take 1.5 tablets daily [facility] refills Patient may need PA with that dose/quantity

## 2020-01-12 NOTE — Telephone Encounter (Signed)
Pt. Currently takes 20 mg. He was taking 10 but increased it back to 20 and then was asked to take 15 mg. He has been on 15 mg for awhile. Can you please send in the 10 mg so he can take 1 1/2.

## 2020-01-13 DIAGNOSIS — F4323 Adjustment disorder with mixed anxiety and depressed mood: Secondary | ICD-10-CM | POA: Diagnosis not present

## 2020-01-13 NOTE — Telephone Encounter (Signed)
Received prior authorization request today, submitted through [facility]

## 2020-01-13 NOTE — Telephone Encounter (Signed)
Prior authorization approved for Viibryd 10 mg take 1.5 tablets daily, effective 01/13/2020-01/11/2023 with [employer] ID# [id number]

## 2020-01-27 DIAGNOSIS — E669 Obesity, unspecified: Secondary | ICD-10-CM | POA: Diagnosis not present

## 2020-01-27 DIAGNOSIS — Z713 Dietary counseling and surveillance: Secondary | ICD-10-CM | POA: Diagnosis not present

## 2020-01-28 DIAGNOSIS — F4323 Adjustment disorder with mixed anxiety and depressed mood: Secondary | ICD-10-CM | POA: Diagnosis not present

## 2020-02-11 DIAGNOSIS — Z6831 Body mass index (BMI) 31.0-31.9, adult: Secondary | ICD-10-CM | POA: Diagnosis not present

## 2020-02-11 DIAGNOSIS — Z713 Dietary counseling and surveillance: Secondary | ICD-10-CM | POA: Diagnosis not present

## 2020-02-11 DIAGNOSIS — E669 Obesity, unspecified: Secondary | ICD-10-CM | POA: Diagnosis not present

## 2020-02-11 DIAGNOSIS — F4323 Adjustment disorder with mixed anxiety and depressed mood: Secondary | ICD-10-CM | POA: Diagnosis not present

## 2020-02-26 DIAGNOSIS — E785 Hyperlipidemia, unspecified: Secondary | ICD-10-CM | POA: Diagnosis not present

## 2020-02-26 DIAGNOSIS — Z6832 Body mass index (BMI) 32.0-32.9, adult: Secondary | ICD-10-CM | POA: Diagnosis not present

## 2020-02-26 DIAGNOSIS — R7301 Impaired fasting glucose: Secondary | ICD-10-CM | POA: Diagnosis not present

## 2020-02-26 DIAGNOSIS — E669 Obesity, unspecified: Secondary | ICD-10-CM | POA: Diagnosis not present

## 2020-02-27 ENCOUNTER — Encounter: Payer: Self-pay | Admitting: Psychiatry

## 2020-02-27 ENCOUNTER — Ambulatory Visit (INDEPENDENT_AMBULATORY_CARE_PROVIDER_SITE_OTHER): Payer: BC Managed Care – PPO | Admitting: Psychiatry

## 2020-02-27 ENCOUNTER — Other Ambulatory Visit: Payer: Self-pay

## 2020-02-27 DIAGNOSIS — F5081 Binge eating disorder: Secondary | ICD-10-CM

## 2020-02-27 DIAGNOSIS — F09 Unspecified mental disorder due to known physiological condition: Secondary | ICD-10-CM

## 2020-02-27 DIAGNOSIS — F331 Major depressive disorder, recurrent, moderate: Secondary | ICD-10-CM | POA: Diagnosis not present

## 2020-02-27 DIAGNOSIS — F411 Generalized anxiety disorder: Secondary | ICD-10-CM

## 2020-02-27 DIAGNOSIS — F900 Attention-deficit hyperactivity disorder, predominantly inattentive type: Secondary | ICD-10-CM

## 2020-02-27 DIAGNOSIS — F5105 Insomnia due to other mental disorder: Secondary | ICD-10-CM | POA: Diagnosis not present

## 2020-02-27 MED ORDER — LORAZEPAM 0.5 MG PO TABS: 0.5000 mg | ORAL_TABLET | Freq: Every day | ORAL | 0 refills | Status: DC | PRN
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
## 2020-03-01 ENCOUNTER — Other Ambulatory Visit: Payer: Self-pay

## 2020-03-01 ENCOUNTER — Telehealth: Payer: Self-pay | Admitting: Psychiatry

## 2020-03-01 DIAGNOSIS — F900 Attention-deficit hyperactivity disorder, predominantly inattentive type: Secondary | ICD-10-CM

## 2020-03-01 MED ORDER — LISDEXAMFETAMINE DIMESYLATE 40 MG PO CAPS: 40.0000 mg | ORAL_CAPSULE | ORAL | 0 refills | Status: DC

## 2020-03-01 MED ORDER — LISDEXAMFETAMINE DIMESYLATE 40 MG PO CAPS: 40.0000 mg | ORAL_CAPSULE | Freq: Every day | ORAL | 0 refills | Status: DC

## 2020-03-01 NOTE — Telephone Encounter (Signed)
Last refill 01/27/2020, just seen 02/27/2020 Pended 3 RX's for Dr. Clovis Pu to submit

## 2020-03-01 NOTE — Telephone Encounter (Signed)
Pt called again requesting refill for Vyvanse 40 mg. Had left message this morning also. Was in office Friday and did not realize needed more Rx sent in. [facility]

## 2020-03-02 DIAGNOSIS — Z713 Dietary counseling and surveillance: Secondary | ICD-10-CM | POA: Diagnosis not present

## 2020-03-02 DIAGNOSIS — Z6831 Body mass index (BMI) 31.0-31.9, adult: Secondary | ICD-10-CM | POA: Diagnosis not present

## 2020-03-02 DIAGNOSIS — E669 Obesity, unspecified: Secondary | ICD-10-CM | POA: Diagnosis not present

## 2020-03-03 DIAGNOSIS — F4323 Adjustment disorder with mixed anxiety and depressed mood: Secondary | ICD-10-CM | POA: Diagnosis not present

## 2020-03-08 ENCOUNTER — Telehealth: Payer: Self-pay | Admitting: Psychiatry

## 2020-03-08 ENCOUNTER — Other Ambulatory Visit: Payer: Self-pay

## 2020-03-08 MED ORDER — BELSOMRA 15 MG PO TABS: 15.0000 mg | ORAL_TABLET | Freq: Every day | ORAL | 2 refills | Status: DC

## 2020-03-08 NOTE — Telephone Encounter (Signed)
Noted pended RX Belsomra 15 mg 1 q hs, #30, 2 refills

## 2020-03-08 NOTE — Telephone Encounter (Signed)
Pt is calling with an update on the sleeping medication that he was given to try. Pt would like a rx for Belsomra 15mg  [facility] on [address].

## 2020-03-09 DIAGNOSIS — R7301 Impaired fasting glucose: Secondary | ICD-10-CM | POA: Diagnosis not present

## 2020-03-09 DIAGNOSIS — E785 Hyperlipidemia, unspecified: Secondary | ICD-10-CM | POA: Diagnosis not present

## 2020-03-09 NOTE — Telephone Encounter (Signed)
Prior authorization submitted for BELSOMRA 15 mg through [place] approved effective 03/09/2020-03/10/2023.    Submitted through cover my meds

## 2020-03-09 NOTE — Telephone Encounter (Signed)
Received prior authorization and submitted today

## 2020-03-10 ENCOUNTER — Telehealth: Payer: Self-pay | Admitting: Psychiatry

## 2020-03-10 ENCOUNTER — Other Ambulatory Visit: Payer: Self-pay

## 2020-03-10 MED ORDER — BELSOMRA 15 MG PO TABS: 15.0000 mg | ORAL_TABLET | Freq: Every day | ORAL | 2 refills | Status: DC

## 2020-03-10 NOTE — Telephone Encounter (Signed)
New Rx for Belsomra 15 mg [facility]

## 2020-03-10 NOTE — Telephone Encounter (Signed)
[facility] [address] [facility] about [facility] Belsomra.  When trying to run the prescription through the PA it deleted the presrciption.  Please send it to them again for processing.

## 2020-03-22 ENCOUNTER — Other Ambulatory Visit: Payer: Self-pay | Admitting: Psychiatry

## 2020-03-22 ENCOUNTER — Telehealth: Payer: Self-pay | Admitting: Psychiatry

## 2020-03-22 DIAGNOSIS — F5105 Insomnia due to other mental disorder: Secondary | ICD-10-CM

## 2020-03-22 MED ORDER — DAYVIGO 5 MG PO TABS: 1.0000 | ORAL_TABLET | Freq: Every evening | ORAL | 2 refills | Status: DC

## 2020-03-22 NOTE — Telephone Encounter (Signed)
Joshua Pearson reports that the Belsomra causes bad leg cramps but the Dayvigo works fine so needs a prescription for the [employer].  [facility] the [facility] [address].  Appt 04/30/20

## 2020-03-29 ENCOUNTER — Other Ambulatory Visit: Payer: Self-pay | Admitting: Psychiatry

## 2020-03-31 DIAGNOSIS — E669 Obesity, unspecified: Secondary | ICD-10-CM | POA: Diagnosis not present

## 2020-03-31 DIAGNOSIS — Z713 Dietary counseling and surveillance: Secondary | ICD-10-CM | POA: Diagnosis not present

## 2020-04-05 DIAGNOSIS — N401 Enlarged prostate with lower urinary tract symptoms: Secondary | ICD-10-CM | POA: Diagnosis not present

## 2020-04-05 DIAGNOSIS — N138 Other obstructive and reflux uropathy: Secondary | ICD-10-CM | POA: Diagnosis not present

## 2020-04-05 DIAGNOSIS — N5201 Erectile dysfunction due to arterial insufficiency: Secondary | ICD-10-CM | POA: Diagnosis not present

## 2020-04-05 DIAGNOSIS — E291 Testicular hypofunction: Secondary | ICD-10-CM | POA: Diagnosis not present

## 2020-04-06 DIAGNOSIS — Z6832 Body mass index (BMI) 32.0-32.9, adult: Secondary | ICD-10-CM | POA: Diagnosis not present

## 2020-04-06 DIAGNOSIS — E781 Pure hyperglyceridemia: Secondary | ICD-10-CM | POA: Diagnosis not present

## 2020-04-06 DIAGNOSIS — R7301 Impaired fasting glucose: Secondary | ICD-10-CM | POA: Diagnosis not present

## 2020-04-06 DIAGNOSIS — E786 Lipoprotein deficiency: Secondary | ICD-10-CM | POA: Diagnosis not present

## 2020-04-12 DIAGNOSIS — F4321 Adjustment disorder with depressed mood: Secondary | ICD-10-CM | POA: Diagnosis not present

## 2020-04-19 DIAGNOSIS — F4321 Adjustment disorder with depressed mood: Secondary | ICD-10-CM | POA: Diagnosis not present

## 2020-04-26 DIAGNOSIS — F4321 Adjustment disorder with depressed mood: Secondary | ICD-10-CM | POA: Diagnosis not present

## 2020-04-30 ENCOUNTER — Other Ambulatory Visit: Payer: Self-pay

## 2020-04-30 ENCOUNTER — Ambulatory Visit (INDEPENDENT_AMBULATORY_CARE_PROVIDER_SITE_OTHER): Payer: BC Managed Care – PPO | Admitting: Psychiatry

## 2020-04-30 ENCOUNTER — Encounter: Payer: Self-pay | Admitting: Psychiatry

## 2020-04-30 DIAGNOSIS — F3341 Major depressive disorder, recurrent, in partial remission: Secondary | ICD-10-CM | POA: Diagnosis not present

## 2020-04-30 DIAGNOSIS — F411 Generalized anxiety disorder: Secondary | ICD-10-CM | POA: Diagnosis not present

## 2020-04-30 DIAGNOSIS — F900 Attention-deficit hyperactivity disorder, predominantly inattentive type: Secondary | ICD-10-CM

## 2020-04-30 DIAGNOSIS — F50819 Binge eating disorder, unspecified: Secondary | ICD-10-CM

## 2020-04-30 DIAGNOSIS — F5081 Binge eating disorder: Secondary | ICD-10-CM

## 2020-04-30 DIAGNOSIS — F09 Unspecified mental disorder due to known physiological condition: Secondary | ICD-10-CM

## 2020-04-30 DIAGNOSIS — F5105 Insomnia due to other mental disorder: Secondary | ICD-10-CM

## 2020-04-30 DIAGNOSIS — G4733 Obstructive sleep apnea (adult) (pediatric): Secondary | ICD-10-CM

## 2020-04-30 MED ORDER — DAYVIGO 10 MG PO TABS: 1.0000 | ORAL_TABLET | Freq: Every day | ORAL | 3 refills | Status: DC
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
## 2020-05-10 DIAGNOSIS — F4321 Adjustment disorder with depressed mood: Secondary | ICD-10-CM | POA: Diagnosis not present

## 2020-05-17 DIAGNOSIS — F4321 Adjustment disorder with depressed mood: Secondary | ICD-10-CM | POA: Diagnosis not present

## 2020-05-20 DIAGNOSIS — E669 Obesity, unspecified: Secondary | ICD-10-CM | POA: Diagnosis not present

## 2020-05-20 DIAGNOSIS — E786 Lipoprotein deficiency: Secondary | ICD-10-CM | POA: Diagnosis not present

## 2020-05-20 DIAGNOSIS — R632 Polyphagia: Secondary | ICD-10-CM | POA: Diagnosis not present

## 2020-05-20 DIAGNOSIS — Z6832 Body mass index (BMI) 32.0-32.9, adult: Secondary | ICD-10-CM | POA: Diagnosis not present

## 2020-05-26 DIAGNOSIS — E291 Testicular hypofunction: Secondary | ICD-10-CM | POA: Diagnosis not present

## 2020-05-27 ENCOUNTER — Other Ambulatory Visit: Payer: Self-pay | Admitting: Psychiatry

## 2020-05-30 ENCOUNTER — Other Ambulatory Visit: Payer: Self-pay | Admitting: Psychiatry

## 2020-05-30 DIAGNOSIS — F900 Attention-deficit hyperactivity disorder, predominantly inattentive type: Secondary | ICD-10-CM

## 2020-05-30 DIAGNOSIS — F5105 Insomnia due to other mental disorder: Secondary | ICD-10-CM

## 2020-05-31 ENCOUNTER — Other Ambulatory Visit: Payer: Self-pay

## 2020-05-31 DIAGNOSIS — F4321 Adjustment disorder with depressed mood: Secondary | ICD-10-CM | POA: Diagnosis not present

## 2020-05-31 DIAGNOSIS — F900 Attention-deficit hyperactivity disorder, predominantly inattentive type: Secondary | ICD-10-CM

## 2020-05-31 MED ORDER — LISDEXAMFETAMINE DIMESYLATE 40 MG PO CAPS: 40.0000 mg | ORAL_CAPSULE | Freq: Every day | ORAL | 0 refills | Status: DC

## 2020-05-31 MED ORDER — LISDEXAMFETAMINE DIMESYLATE 40 MG PO CAPS: 40.0000 mg | ORAL_CAPSULE | ORAL | 0 refills | Status: DC

## 2020-05-31 NOTE — Telephone Encounter (Signed)
Pt is requesting Vyvanse RF.

## 2020-06-02 DIAGNOSIS — R635 Abnormal weight gain: Secondary | ICD-10-CM | POA: Diagnosis not present

## 2020-06-07 DIAGNOSIS — F4321 Adjustment disorder with depressed mood: Secondary | ICD-10-CM | POA: Diagnosis not present

## 2020-06-14 DIAGNOSIS — F4321 Adjustment disorder with depressed mood: Secondary | ICD-10-CM | POA: Diagnosis not present

## 2020-06-18 DIAGNOSIS — G4733 Obstructive sleep apnea (adult) (pediatric): Secondary | ICD-10-CM | POA: Diagnosis not present

## 2020-06-21 DIAGNOSIS — Z23 Encounter for immunization: Secondary | ICD-10-CM | POA: Diagnosis not present

## 2020-06-21 DIAGNOSIS — E782 Mixed hyperlipidemia: Secondary | ICD-10-CM | POA: Diagnosis not present

## 2020-06-21 DIAGNOSIS — R7303 Prediabetes: Secondary | ICD-10-CM | POA: Diagnosis not present

## 2020-06-21 DIAGNOSIS — E291 Testicular hypofunction: Secondary | ICD-10-CM | POA: Diagnosis not present

## 2020-06-21 DIAGNOSIS — E039 Hypothyroidism, unspecified: Secondary | ICD-10-CM | POA: Diagnosis not present

## 2020-06-28 DIAGNOSIS — F4321 Adjustment disorder with depressed mood: Secondary | ICD-10-CM | POA: Diagnosis not present

## 2020-07-01 DIAGNOSIS — F4321 Adjustment disorder with depressed mood: Secondary | ICD-10-CM | POA: Diagnosis not present

## 2020-07-05 DIAGNOSIS — F4321 Adjustment disorder with depressed mood: Secondary | ICD-10-CM | POA: Diagnosis not present

## 2020-07-08 DIAGNOSIS — F4321 Adjustment disorder with depressed mood: Secondary | ICD-10-CM | POA: Diagnosis not present

## 2020-07-19 DIAGNOSIS — F4321 Adjustment disorder with depressed mood: Secondary | ICD-10-CM | POA: Diagnosis not present

## 2020-07-20 DIAGNOSIS — E291 Testicular hypofunction: Secondary | ICD-10-CM | POA: Diagnosis not present

## 2020-07-22 ENCOUNTER — Other Ambulatory Visit: Payer: Self-pay | Admitting: Psychiatry

## 2020-07-22 DIAGNOSIS — F4321 Adjustment disorder with depressed mood: Secondary | ICD-10-CM | POA: Diagnosis not present

## 2020-07-27 ENCOUNTER — Ambulatory Visit (INDEPENDENT_AMBULATORY_CARE_PROVIDER_SITE_OTHER): Payer: BC Managed Care – PPO | Admitting: Psychiatry

## 2020-07-27 ENCOUNTER — Other Ambulatory Visit: Payer: Self-pay

## 2020-07-27 ENCOUNTER — Encounter: Payer: Self-pay | Admitting: Psychiatry

## 2020-07-27 DIAGNOSIS — F09 Unspecified mental disorder due to known physiological condition: Secondary | ICD-10-CM

## 2020-07-27 DIAGNOSIS — F5081 Binge eating disorder: Secondary | ICD-10-CM

## 2020-07-27 DIAGNOSIS — F5105 Insomnia due to other mental disorder: Secondary | ICD-10-CM

## 2020-07-27 DIAGNOSIS — G4733 Obstructive sleep apnea (adult) (pediatric): Secondary | ICD-10-CM

## 2020-07-27 DIAGNOSIS — F411 Generalized anxiety disorder: Secondary | ICD-10-CM

## 2020-07-27 DIAGNOSIS — F3342 Major depressive disorder, recurrent, in full remission: Secondary | ICD-10-CM | POA: Diagnosis not present

## 2020-07-27 DIAGNOSIS — F900 Attention-deficit hyperactivity disorder, predominantly inattentive type: Secondary | ICD-10-CM

## 2020-07-27 MED ORDER — LISDEXAMFETAMINE DIMESYLATE 40 MG PO CAPS: 40.0000 mg | ORAL_CAPSULE | ORAL | 0 refills | Status: DC

## 2020-07-27 MED ORDER — LISDEXAMFETAMINE DIMESYLATE 40 MG PO CAPS: 40.0000 mg | ORAL_CAPSULE | Freq: Every day | ORAL | 0 refills | Status: DC
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
## 2020-07-29 DIAGNOSIS — F4321 Adjustment disorder with depressed mood: Secondary | ICD-10-CM | POA: Diagnosis not present

## 2020-08-10 ENCOUNTER — Telehealth: Payer: Self-pay | Admitting: Psychiatry

## 2020-08-10 NOTE — Telephone Encounter (Signed)
It appears the anxiety is worsening because of the reduction in [place].  He has 2 options: He can go back up in the Viibryd to the effective dose which I believe was 20 mg for him.  Or if he wants to change in hopes of less sexual side effects we could switch to Trintellix  but usually it is less effective for anxiety than is Viibryd. If he wants to try Trintellix he would stop Viibryd and start Trintellix 5 mg for 1 week and then go to 10 mg daily.  The main side effect risk with it is nausea.

## 2020-08-10 NOTE — Telephone Encounter (Signed)
Pt called to report he is very concerned. His anxiety is rapidly increasing. Mentioned alternate med at last apt. Requesting work in apt. Contact @ [phone].   Pt added to canc list.

## 2020-08-11 NOTE — Telephone Encounter (Signed)
LM to call back and discuss options.

## 2020-08-12 DIAGNOSIS — F4321 Adjustment disorder with depressed mood: Secondary | ICD-10-CM | POA: Diagnosis not present

## 2020-08-12 NOTE — Telephone Encounter (Signed)
Includes clinical experience Trintellix at dosages 10 mg were below has a low sexual side effect risk.  The risk at 20 mg is higher but is not likely that he would ever need to go to 20 mg on Trintellix.  The short acting antianxiety meds are not a good idea for him because he already struggles with cognitive concerns and those short acting meds are sedating and cause cognitive slowness, for example meds like lorazepam.  The best option is to stick with the antianxiety antidepressants which in this case are either Viibryd or Trintellix.  I think he is got the dose of Viibryd so low it is just not helping in my suggestion initially is that he go back up to 10 mg and get the anxiety under better control and then if we want to switch once he is doing better switch to then.  That improves likelihood of success.

## 2020-08-12 NOTE — Telephone Encounter (Signed)
Rtc to patient and discussed options; he's currently taking viibryd 5 mg (was on 10 mg) previously but reduced due to sexual side effects. He said the Viibryd works very well for him but there are days that are bad and he's asking for a short acting medication to go along with this? You had advised him ativan wasn't good to use and didn't know if you had an alternative for him to try.   He did read up on Trintellix, when previously discussed and he read sexual side effects is 15-30 % for men. He's concerned because the Viibryd was only 0-3% which he experienced that significantly. Asking your thoughts on that? He also realizes it won't help as much with his anxiety.   Please advise

## 2020-08-13 NOTE — Telephone Encounter (Signed)
Patient aware and only took Viibryd 5 mg for 3 days and quickly realized it would not keep his anxiety down. After discussing things with him, he will keep the Viibryd at 10 mg since it seems to be the best fit. Advised to call back with further concerns.

## 2020-08-16 DIAGNOSIS — F4321 Adjustment disorder with depressed mood: Secondary | ICD-10-CM | POA: Diagnosis not present

## 2020-08-17 DIAGNOSIS — E781 Pure hyperglyceridemia: Secondary | ICD-10-CM | POA: Diagnosis not present

## 2020-08-17 DIAGNOSIS — F5089 Other specified eating disorder: Secondary | ICD-10-CM | POA: Diagnosis not present

## 2020-08-17 DIAGNOSIS — E786 Lipoprotein deficiency: Secondary | ICD-10-CM | POA: Diagnosis not present

## 2020-08-17 DIAGNOSIS — Z6831 Body mass index (BMI) 31.0-31.9, adult: Secondary | ICD-10-CM | POA: Diagnosis not present

## 2020-08-19 DIAGNOSIS — F4321 Adjustment disorder with depressed mood: Secondary | ICD-10-CM | POA: Diagnosis not present

## 2020-08-26 DIAGNOSIS — F4321 Adjustment disorder with depressed mood: Secondary | ICD-10-CM | POA: Diagnosis not present

## 2020-09-02 DIAGNOSIS — F4321 Adjustment disorder with depressed mood: Secondary | ICD-10-CM | POA: Diagnosis not present

## 2020-09-06 DIAGNOSIS — F4321 Adjustment disorder with depressed mood: Secondary | ICD-10-CM | POA: Diagnosis not present

## 2020-09-07 ENCOUNTER — Other Ambulatory Visit: Payer: Self-pay | Admitting: Psychiatry

## 2020-09-07 DIAGNOSIS — F5105 Insomnia due to other mental disorder: Secondary | ICD-10-CM

## 2020-09-07 NOTE — Telephone Encounter (Signed)
Unclear if he's taking this still?

## 2020-09-09 DIAGNOSIS — F4321 Adjustment disorder with depressed mood: Secondary | ICD-10-CM | POA: Diagnosis not present

## 2020-09-12 ENCOUNTER — Other Ambulatory Visit: Payer: Self-pay | Admitting: Psychiatry

## 2020-09-13 NOTE — Telephone Encounter (Signed)
review 

## 2020-09-14 ENCOUNTER — Ambulatory Visit (INDEPENDENT_AMBULATORY_CARE_PROVIDER_SITE_OTHER): Payer: BC Managed Care – PPO | Admitting: Sports Medicine

## 2020-09-14 ENCOUNTER — Encounter: Payer: Self-pay | Admitting: Sports Medicine

## 2020-09-14 ENCOUNTER — Other Ambulatory Visit: Payer: Self-pay

## 2020-09-14 DIAGNOSIS — M898X1 Other specified disorders of bone, shoulder: Secondary | ICD-10-CM | POA: Diagnosis not present

## 2020-09-14 DIAGNOSIS — M4000 Postural kyphosis, site unspecified: Secondary | ICD-10-CM

## 2020-09-14 DIAGNOSIS — F4321 Adjustment disorder with depressed mood: Secondary | ICD-10-CM | POA: Diagnosis not present

## 2020-09-14 NOTE — Assessment & Plan Note (Signed)
Mild kyphosis Patient advised to practice adjusting posture with focus on ears being midline with shoulders, cheek bones over breastbone and focusing on maintaining a straight back

## 2020-09-14 NOTE — Assessment & Plan Note (Signed)
Patient was recommended to do the following exercises:  - I and T stretches  - isometric neck exercises  - upright and low row  - external pectoral fly

## 2020-09-14 NOTE — Progress Notes (Signed)
   PCP: Shirline Frees, MD  Subjective:   HPI: Patient is a 54 y.o. male here for follow up concerning upper back pain. 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 He reports that he has surgery in 2017 and has not been able to play tennis since then as it inflames the area surrounding his scapula on the right. He describes the pain as a "stabbing" pain. He also has discomfort with vibration of handlebars when he does mountain biking. Patient reports that his triceps feel weaker on the right in comparison to the left.   Patient is also requesting recommendations on how to improve his posture as he notices several men in his family have posture that is "hunched over".       Objective:  Physical Exam: [place] Adult Exercise 09/14/2020  Frequency of aerobic exercise (# of days/week) 3  Average time in minutes 35  Frequency of strengthening activities (# of days/week) 3   BP (!) 114/98   Ht 6\' 1"  (1.854 m)   Wt 238 lb (108 kg)   BMI 31.40 kg/m   Gen: awake, alert, NAD, comfortable in exam room Pulm: breathing unlabored No gross deformity,no scoliosis. Patient posture is slightly leaned forward    No midline or bony TTP. Full ROM of neck and back.    Assessment & Plan:   Periscapular pain Patient was recommended to do the following exercises:  - I and T stretches  - isometric neck exercises  - upright and low row  - external pectoral fly    Kyphosis (acquired) (postural) Mild kyphosis Patient advised to practice adjusting posture with focus on ears being midline with shoulders, cheek bones over breastbone and focusing on maintaining a straight back   Follow up PRN  Eulis Foster, MD [place], PGY2 09/14/2020 12:11 PM   I observed and examined the patient with the resident and agree with assessment and plan.  Note reviewed and modified by me. I would like to get him into Yoga or similar class if he does not progress  with HEP.  He overemphasizes the strength work but more for anterior chest.  Will see his response to this plan and refer if not responding well.  Ila Mcgill, MD

## 2020-09-14 NOTE — Patient Instructions (Signed)
Thank you for choosing [facility].   Today we discussed exercises and stretches to help with your posture and cervical spine pain.   Please complete these exercises and stretches 3-5 times daily.    We will provide you with pictures for references as you develop your routine.    Please follow up with our office as needed.

## 2020-09-16 DIAGNOSIS — G4733 Obstructive sleep apnea (adult) (pediatric): Secondary | ICD-10-CM | POA: Diagnosis not present

## 2020-09-21 DIAGNOSIS — F4320 Adjustment disorder, unspecified: Secondary | ICD-10-CM | POA: Diagnosis not present

## 2020-09-27 ENCOUNTER — Telehealth: Payer: Self-pay | Admitting: Psychiatry

## 2020-09-27 ENCOUNTER — Encounter: Payer: Self-pay | Admitting: Psychiatry

## 2020-09-27 ENCOUNTER — Ambulatory Visit (INDEPENDENT_AMBULATORY_CARE_PROVIDER_SITE_OTHER): Payer: BC Managed Care – PPO | Admitting: Psychiatry

## 2020-09-27 ENCOUNTER — Other Ambulatory Visit: Payer: Self-pay

## 2020-09-27 DIAGNOSIS — F900 Attention-deficit hyperactivity disorder, predominantly inattentive type: Secondary | ICD-10-CM

## 2020-09-27 DIAGNOSIS — G4733 Obstructive sleep apnea (adult) (pediatric): Secondary | ICD-10-CM

## 2020-09-27 DIAGNOSIS — F5081 Binge eating disorder: Secondary | ICD-10-CM | POA: Diagnosis not present

## 2020-09-27 DIAGNOSIS — F411 Generalized anxiety disorder: Secondary | ICD-10-CM

## 2020-09-27 DIAGNOSIS — F3342 Major depressive disorder, recurrent, in full remission: Secondary | ICD-10-CM

## 2020-09-27 DIAGNOSIS — F5105 Insomnia due to other mental disorder: Secondary | ICD-10-CM

## 2020-09-27 DIAGNOSIS — F09 Unspecified mental disorder due to known physiological condition: Secondary | ICD-10-CM

## 2020-09-27 DIAGNOSIS — F50813 Binge eating disorder, extreme: Secondary | ICD-10-CM

## 2020-09-27 MED ORDER — LISDEXAMFETAMINE DIMESYLATE 40 MG PO CAPS: 40.0000 mg | ORAL_CAPSULE | ORAL | 0 refills | Status: DC

## 2020-09-27 MED ORDER — TOPIRAMATE 25 MG PO TABS: ORAL_TABLET | ORAL | 1 refills | Status: DC

## 2020-09-27 MED ORDER — LISDEXAMFETAMINE DIMESYLATE 40 MG PO CAPS: 40.0000 mg | ORAL_CAPSULE | Freq: Every day | ORAL | 0 refills | Status: DC
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
## 2020-09-27 NOTE — Telephone Encounter (Signed)
Please review

## 2020-09-27 NOTE — Telephone Encounter (Signed)
Patient saw Dr. Clovis Pu this morning and he gave him an RX for Topamax. He just called and said he has previously taken Topamax before from another doctor but it had too many side effects. He wont be picking up the one called in to pharmacy this morning.  Could he get a replacement other than Topamax? He uses [facility] on [address].

## 2020-09-28 DIAGNOSIS — F4320 Adjustment disorder, unspecified: Secondary | ICD-10-CM | POA: Diagnosis not present

## 2020-09-30 ENCOUNTER — Other Ambulatory Visit: Payer: Self-pay | Admitting: Psychiatry

## 2020-09-30 DIAGNOSIS — F5081 Binge eating disorder: Secondary | ICD-10-CM

## 2020-09-30 DIAGNOSIS — F4321 Adjustment disorder with depressed mood: Secondary | ICD-10-CM | POA: Diagnosis not present

## 2020-09-30 MED ORDER — NALTREXONE HCL 50 MG PO TABS: 25.0000 mg | ORAL_TABLET | Freq: Every evening | ORAL | 1 refills | Status: DC

## 2020-09-30 NOTE — Telephone Encounter (Signed)
Okay sent in alternative to topiramate naltrexone 1/2 tablet in the evening.  If that does not help after a couple of weeks he can go up to 1 tablet.  He mentioned he did not want to take topiramate because of history of side effects.  Please make sure that he when he took topiramate before he took it at a low dose because the side effects of that medicine are definitely dose-related.  He has a history of a seizure disorder and if he took it for seizures at high dose than that has significantly more side effects than the lower dose I was prescribing.

## 2020-10-01 DIAGNOSIS — E291 Testicular hypofunction: Secondary | ICD-10-CM | POA: Diagnosis not present

## 2020-10-05 ENCOUNTER — Other Ambulatory Visit: Payer: Self-pay | Admitting: Psychiatry

## 2020-10-05 NOTE — Telephone Encounter (Signed)
Spoke with patient and he states that he is allergic to the Topamax and that he has previously taken the Naltrexone and states that it did nothing for him. He would like to know if you have any other suggestions? Please advise.

## 2020-10-05 NOTE — Telephone Encounter (Signed)
review 

## 2020-10-05 NOTE — Telephone Encounter (Signed)
Sorry I have no other options to offer

## 2020-10-06 DIAGNOSIS — F4321 Adjustment disorder with depressed mood: Secondary | ICD-10-CM | POA: Diagnosis not present

## 2020-10-08 NOTE — Telephone Encounter (Signed)
Patient made aware.

## 2020-10-12 DIAGNOSIS — F4320 Adjustment disorder, unspecified: Secondary | ICD-10-CM | POA: Diagnosis not present

## 2020-10-13 DIAGNOSIS — N138 Other obstructive and reflux uropathy: Secondary | ICD-10-CM | POA: Diagnosis not present

## 2020-10-13 DIAGNOSIS — N401 Enlarged prostate with lower urinary tract symptoms: Secondary | ICD-10-CM | POA: Diagnosis not present

## 2020-10-13 DIAGNOSIS — N5201 Erectile dysfunction due to arterial insufficiency: Secondary | ICD-10-CM | POA: Diagnosis not present

## 2020-10-13 DIAGNOSIS — E291 Testicular hypofunction: Secondary | ICD-10-CM | POA: Diagnosis not present

## 2020-10-14 DIAGNOSIS — F4321 Adjustment disorder with depressed mood: Secondary | ICD-10-CM | POA: Diagnosis not present

## 2020-10-18 DIAGNOSIS — E291 Testicular hypofunction: Secondary | ICD-10-CM | POA: Diagnosis not present

## 2020-10-27 DIAGNOSIS — F4320 Adjustment disorder, unspecified: Secondary | ICD-10-CM | POA: Diagnosis not present

## 2020-10-28 DIAGNOSIS — F4321 Adjustment disorder with depressed mood: Secondary | ICD-10-CM | POA: Diagnosis not present

## 2020-11-02 DIAGNOSIS — R632 Polyphagia: Secondary | ICD-10-CM | POA: Diagnosis not present

## 2020-11-02 DIAGNOSIS — R635 Abnormal weight gain: Secondary | ICD-10-CM | POA: Diagnosis not present

## 2020-11-02 DIAGNOSIS — Z6832 Body mass index (BMI) 32.0-32.9, adult: Secondary | ICD-10-CM | POA: Diagnosis not present

## 2020-11-02 DIAGNOSIS — E786 Lipoprotein deficiency: Secondary | ICD-10-CM | POA: Diagnosis not present

## 2020-11-04 DIAGNOSIS — F4321 Adjustment disorder with depressed mood: Secondary | ICD-10-CM | POA: Diagnosis not present

## 2020-11-09 DIAGNOSIS — F4320 Adjustment disorder, unspecified: Secondary | ICD-10-CM | POA: Diagnosis not present

## 2020-11-11 DIAGNOSIS — F4321 Adjustment disorder with depressed mood: Secondary | ICD-10-CM | POA: Diagnosis not present

## 2020-11-15 DIAGNOSIS — F4321 Adjustment disorder with depressed mood: Secondary | ICD-10-CM | POA: Diagnosis not present

## 2020-11-25 DIAGNOSIS — F4321 Adjustment disorder with depressed mood: Secondary | ICD-10-CM | POA: Diagnosis not present

## 2020-12-02 DIAGNOSIS — F4321 Adjustment disorder with depressed mood: Secondary | ICD-10-CM | POA: Diagnosis not present

## 2020-12-05 ENCOUNTER — Other Ambulatory Visit: Payer: Self-pay | Admitting: Psychiatry

## 2020-12-09 DIAGNOSIS — F4321 Adjustment disorder with depressed mood: Secondary | ICD-10-CM | POA: Diagnosis not present

## 2020-12-13 DIAGNOSIS — G40209 Localization-related (focal) (partial) symptomatic epilepsy and epileptic syndromes with complex partial seizures, not intractable, without status epilepticus: Secondary | ICD-10-CM | POA: Diagnosis not present

## 2020-12-13 DIAGNOSIS — R419 Unspecified symptoms and signs involving cognitive functions and awareness: Secondary | ICD-10-CM | POA: Diagnosis not present

## 2020-12-13 DIAGNOSIS — Q282 Arteriovenous malformation of cerebral vessels: Secondary | ICD-10-CM | POA: Diagnosis not present

## 2020-12-20 DIAGNOSIS — D2262 Melanocytic nevi of left upper limb, including shoulder: Secondary | ICD-10-CM | POA: Diagnosis not present

## 2020-12-20 DIAGNOSIS — D225 Melanocytic nevi of trunk: Secondary | ICD-10-CM | POA: Diagnosis not present

## 2020-12-20 DIAGNOSIS — G4733 Obstructive sleep apnea (adult) (pediatric): Secondary | ICD-10-CM | POA: Diagnosis not present

## 2020-12-20 DIAGNOSIS — D1801 Hemangioma of skin and subcutaneous tissue: Secondary | ICD-10-CM | POA: Diagnosis not present

## 2020-12-20 DIAGNOSIS — D2261 Melanocytic nevi of right upper limb, including shoulder: Secondary | ICD-10-CM | POA: Diagnosis not present

## 2020-12-23 DIAGNOSIS — F4321 Adjustment disorder with depressed mood: Secondary | ICD-10-CM | POA: Diagnosis not present

## 2020-12-28 ENCOUNTER — Ambulatory Visit (INDEPENDENT_AMBULATORY_CARE_PROVIDER_SITE_OTHER): Payer: BC Managed Care – PPO | Admitting: Psychiatry

## 2020-12-28 ENCOUNTER — Other Ambulatory Visit: Payer: Self-pay

## 2020-12-28 ENCOUNTER — Other Ambulatory Visit: Payer: Self-pay | Admitting: Psychiatry

## 2020-12-28 ENCOUNTER — Encounter: Payer: Self-pay | Admitting: Psychiatry

## 2020-12-28 DIAGNOSIS — F411 Generalized anxiety disorder: Secondary | ICD-10-CM

## 2020-12-28 DIAGNOSIS — F3342 Major depressive disorder, recurrent, in full remission: Secondary | ICD-10-CM

## 2020-12-28 DIAGNOSIS — G4733 Obstructive sleep apnea (adult) (pediatric): Secondary | ICD-10-CM

## 2020-12-28 DIAGNOSIS — F09 Unspecified mental disorder due to known physiological condition: Secondary | ICD-10-CM

## 2020-12-28 DIAGNOSIS — F5105 Insomnia due to other mental disorder: Secondary | ICD-10-CM | POA: Diagnosis not present

## 2020-12-28 DIAGNOSIS — F5081 Binge eating disorder: Secondary | ICD-10-CM

## 2020-12-28 DIAGNOSIS — F900 Attention-deficit hyperactivity disorder, predominantly inattentive type: Secondary | ICD-10-CM
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
## 2020-12-29 DIAGNOSIS — R569 Unspecified convulsions: Secondary | ICD-10-CM | POA: Diagnosis not present

## 2020-12-29 DIAGNOSIS — K219 Gastro-esophageal reflux disease without esophagitis: Secondary | ICD-10-CM | POA: Diagnosis not present

## 2020-12-29 DIAGNOSIS — Z1211 Encounter for screening for malignant neoplasm of colon: Secondary | ICD-10-CM | POA: Diagnosis not present

## 2020-12-30 DIAGNOSIS — F4321 Adjustment disorder with depressed mood: Secondary | ICD-10-CM | POA: Diagnosis not present

## 2021-01-03 DIAGNOSIS — E785 Hyperlipidemia, unspecified: Secondary | ICD-10-CM | POA: Diagnosis not present

## 2021-01-03 DIAGNOSIS — R7303 Prediabetes: Secondary | ICD-10-CM | POA: Diagnosis not present

## 2021-01-06 DIAGNOSIS — F4321 Adjustment disorder with depressed mood: Secondary | ICD-10-CM | POA: Diagnosis not present

## 2021-01-13 DIAGNOSIS — F4321 Adjustment disorder with depressed mood: Secondary | ICD-10-CM | POA: Diagnosis not present

## 2021-01-18 ENCOUNTER — Other Ambulatory Visit: Payer: Self-pay | Admitting: Psychiatry

## 2021-01-18 DIAGNOSIS — F5105 Insomnia due to other mental disorder: Secondary | ICD-10-CM

## 2021-01-19 ENCOUNTER — Other Ambulatory Visit: Payer: Self-pay | Admitting: Psychiatry

## 2021-01-19 DIAGNOSIS — F5105 Insomnia due to other mental disorder: Secondary | ICD-10-CM

## 2021-01-20 ENCOUNTER — Other Ambulatory Visit: Payer: Self-pay | Admitting: Psychiatry

## 2021-01-20 DIAGNOSIS — F5105 Insomnia due to other mental disorder: Secondary | ICD-10-CM

## 2021-01-20 DIAGNOSIS — F4321 Adjustment disorder with depressed mood: Secondary | ICD-10-CM | POA: Diagnosis not present

## 2021-01-27 DIAGNOSIS — F4321 Adjustment disorder with depressed mood: Secondary | ICD-10-CM | POA: Diagnosis not present

## 2021-02-03 DIAGNOSIS — F4321 Adjustment disorder with depressed mood: Secondary | ICD-10-CM | POA: Diagnosis not present

## 2021-02-10 DIAGNOSIS — F4321 Adjustment disorder with depressed mood: Secondary | ICD-10-CM | POA: Diagnosis not present

## 2021-02-17 DIAGNOSIS — F4321 Adjustment disorder with depressed mood: Secondary | ICD-10-CM | POA: Diagnosis not present

## 2021-02-24 DIAGNOSIS — F4321 Adjustment disorder with depressed mood: Secondary | ICD-10-CM | POA: Diagnosis not present

## 2021-03-03 DIAGNOSIS — F4321 Adjustment disorder with depressed mood: Secondary | ICD-10-CM | POA: Diagnosis not present

## 2021-03-08 DIAGNOSIS — G4733 Obstructive sleep apnea (adult) (pediatric): Secondary | ICD-10-CM | POA: Diagnosis not present

## 2021-03-16 ENCOUNTER — Other Ambulatory Visit: Payer: Self-pay | Admitting: Psychiatry

## 2021-03-16 DIAGNOSIS — F5105 Insomnia due to other mental disorder: Secondary | ICD-10-CM

## 2021-03-17 DIAGNOSIS — E786 Lipoprotein deficiency: Secondary | ICD-10-CM | POA: Diagnosis not present

## 2021-03-17 DIAGNOSIS — G4733 Obstructive sleep apnea (adult) (pediatric): Secondary | ICD-10-CM | POA: Diagnosis not present

## 2021-03-17 DIAGNOSIS — Z9989 Dependence on other enabling machines and devices: Secondary | ICD-10-CM | POA: Diagnosis not present

## 2021-03-17 DIAGNOSIS — R7303 Prediabetes: Secondary | ICD-10-CM | POA: Diagnosis not present

## 2021-03-17 DIAGNOSIS — F4321 Adjustment disorder with depressed mood: Secondary | ICD-10-CM | POA: Diagnosis not present

## 2021-03-21 ENCOUNTER — Other Ambulatory Visit: Payer: Self-pay | Admitting: Psychiatry

## 2021-03-21 DIAGNOSIS — F5105 Insomnia due to other mental disorder: Secondary | ICD-10-CM

## 2021-03-21 NOTE — Telephone Encounter (Signed)
Apt 4/4

## 2021-03-25 DIAGNOSIS — Z3009 Encounter for other general counseling and advice on contraception: Secondary | ICD-10-CM | POA: Diagnosis not present

## 2021-03-25 DIAGNOSIS — E291 Testicular hypofunction: Secondary | ICD-10-CM | POA: Diagnosis not present

## 2021-03-25 DIAGNOSIS — N5201 Erectile dysfunction due to arterial insufficiency: Secondary | ICD-10-CM | POA: Diagnosis not present

## 2021-03-25 DIAGNOSIS — N401 Enlarged prostate with lower urinary tract symptoms: Secondary | ICD-10-CM | POA: Diagnosis not present

## 2021-03-25 DIAGNOSIS — N138 Other obstructive and reflux uropathy: Secondary | ICD-10-CM | POA: Diagnosis not present

## 2021-03-28 ENCOUNTER — Other Ambulatory Visit: Payer: Self-pay

## 2021-03-28 ENCOUNTER — Ambulatory Visit (INDEPENDENT_AMBULATORY_CARE_PROVIDER_SITE_OTHER): Payer: BC Managed Care – PPO | Admitting: Psychiatry

## 2021-03-28 ENCOUNTER — Encounter: Payer: Self-pay | Admitting: Psychiatry

## 2021-03-28 VITALS — BP 153/96 | HR 77

## 2021-03-28 DIAGNOSIS — F5105 Insomnia due to other mental disorder: Secondary | ICD-10-CM

## 2021-03-28 DIAGNOSIS — F09 Unspecified mental disorder due to known physiological condition: Secondary | ICD-10-CM

## 2021-03-28 DIAGNOSIS — F3342 Major depressive disorder, recurrent, in full remission: Secondary | ICD-10-CM

## 2021-03-28 DIAGNOSIS — F4321 Adjustment disorder with depressed mood: Secondary | ICD-10-CM | POA: Diagnosis not present

## 2021-03-28 DIAGNOSIS — F411 Generalized anxiety disorder: Secondary | ICD-10-CM | POA: Diagnosis not present

## 2021-03-28 DIAGNOSIS — G4733 Obstructive sleep apnea (adult) (pediatric): Secondary | ICD-10-CM

## 2021-03-28 DIAGNOSIS — F5081 Binge eating disorder: Secondary | ICD-10-CM | POA: Diagnosis not present

## 2021-03-28 DIAGNOSIS — F900 Attention-deficit hyperactivity disorder, predominantly inattentive type: Secondary | ICD-10-CM

## 2021-03-28 MED ORDER — DOXEPIN HCL 10 MG PO CAPS: 10.0000 mg | ORAL_CAPSULE | Freq: Every evening | ORAL | 1 refills | Status: DC | PRN

## 2021-03-28 MED ORDER — L-METHYLFOLATE 15 MG PO TABS: 1.0000 | ORAL_TABLET | Freq: Every day | ORAL | 3 refills | Status: DC
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
## 2021-03-31 DIAGNOSIS — F4321 Adjustment disorder with depressed mood: Secondary | ICD-10-CM | POA: Diagnosis not present

## 2021-04-12 DIAGNOSIS — G4733 Obstructive sleep apnea (adult) (pediatric): Secondary | ICD-10-CM | POA: Diagnosis not present

## 2021-04-13 DIAGNOSIS — Z3141 Encounter for fertility testing: Secondary | ICD-10-CM | POA: Diagnosis not present

## 2021-04-14 DIAGNOSIS — F4321 Adjustment disorder with depressed mood: Secondary | ICD-10-CM | POA: Diagnosis not present

## 2021-04-18 DIAGNOSIS — F4321 Adjustment disorder with depressed mood: Secondary | ICD-10-CM | POA: Diagnosis not present

## 2021-04-19 ENCOUNTER — Other Ambulatory Visit: Payer: Self-pay | Admitting: Psychiatry

## 2021-04-19 DIAGNOSIS — F5105 Insomnia due to other mental disorder: Secondary | ICD-10-CM

## 2021-04-26 DIAGNOSIS — F4321 Adjustment disorder with depressed mood: Secondary | ICD-10-CM | POA: Diagnosis not present

## 2021-05-10 DIAGNOSIS — G4733 Obstructive sleep apnea (adult) (pediatric): Secondary | ICD-10-CM | POA: Diagnosis not present

## 2021-05-10 DIAGNOSIS — E669 Obesity, unspecified: Secondary | ICD-10-CM | POA: Diagnosis not present

## 2021-05-10 DIAGNOSIS — E786 Lipoprotein deficiency: Secondary | ICD-10-CM | POA: Diagnosis not present

## 2021-05-10 DIAGNOSIS — Z6831 Body mass index (BMI) 31.0-31.9, adult: Secondary | ICD-10-CM | POA: Diagnosis not present

## 2021-05-12 DIAGNOSIS — F4321 Adjustment disorder with depressed mood: Secondary | ICD-10-CM | POA: Diagnosis not present

## 2021-05-18 ENCOUNTER — Other Ambulatory Visit: Payer: Self-pay | Admitting: Psychiatry

## 2021-05-18 DIAGNOSIS — K148 Other diseases of tongue: Secondary | ICD-10-CM | POA: Diagnosis not present

## 2021-05-18 DIAGNOSIS — F5105 Insomnia due to other mental disorder: Secondary | ICD-10-CM

## 2021-05-24 DIAGNOSIS — E291 Testicular hypofunction: Secondary | ICD-10-CM | POA: Diagnosis not present

## 2021-05-24 DIAGNOSIS — E782 Mixed hyperlipidemia: Secondary | ICD-10-CM | POA: Diagnosis not present

## 2021-05-24 DIAGNOSIS — E039 Hypothyroidism, unspecified: Secondary | ICD-10-CM | POA: Diagnosis not present

## 2021-05-24 DIAGNOSIS — F419 Anxiety disorder, unspecified: Secondary | ICD-10-CM | POA: Diagnosis not present

## 2021-05-24 DIAGNOSIS — R7303 Prediabetes: Secondary | ICD-10-CM | POA: Diagnosis not present

## 2021-05-26 DIAGNOSIS — G4733 Obstructive sleep apnea (adult) (pediatric): Secondary | ICD-10-CM | POA: Diagnosis not present

## 2021-05-30 ENCOUNTER — Other Ambulatory Visit: Payer: Self-pay | Admitting: Psychiatry

## 2021-05-30 DIAGNOSIS — F4321 Adjustment disorder with depressed mood: Secondary | ICD-10-CM | POA: Diagnosis not present

## 2021-06-09 DIAGNOSIS — F4321 Adjustment disorder with depressed mood: Secondary | ICD-10-CM | POA: Diagnosis not present

## 2021-06-23 DIAGNOSIS — F4321 Adjustment disorder with depressed mood: Secondary | ICD-10-CM | POA: Diagnosis not present

## 2021-06-28 DIAGNOSIS — G4733 Obstructive sleep apnea (adult) (pediatric): Secondary | ICD-10-CM | POA: Diagnosis not present

## 2021-07-05 ENCOUNTER — Encounter: Payer: Self-pay | Admitting: Psychiatry

## 2021-07-05 ENCOUNTER — Other Ambulatory Visit: Payer: Self-pay

## 2021-07-05 ENCOUNTER — Ambulatory Visit (INDEPENDENT_AMBULATORY_CARE_PROVIDER_SITE_OTHER): Payer: BC Managed Care – PPO | Admitting: Psychiatry

## 2021-07-05 VITALS — BP 149/106

## 2021-07-05 DIAGNOSIS — F5105 Insomnia due to other mental disorder: Secondary | ICD-10-CM | POA: Diagnosis not present

## 2021-07-05 DIAGNOSIS — F411 Generalized anxiety disorder: Secondary | ICD-10-CM

## 2021-07-05 DIAGNOSIS — F5081 Binge eating disorder: Secondary | ICD-10-CM

## 2021-07-05 DIAGNOSIS — F3342 Major depressive disorder, recurrent, in full remission: Secondary | ICD-10-CM

## 2021-07-05 DIAGNOSIS — G4733 Obstructive sleep apnea (adult) (pediatric): Secondary | ICD-10-CM

## 2021-07-05 DIAGNOSIS — F09 Unspecified mental disorder due to known physiological condition: Secondary | ICD-10-CM

## 2021-07-05 DIAGNOSIS — F900 Attention-deficit hyperactivity disorder, predominantly inattentive type: Secondary | ICD-10-CM | POA: Diagnosis not present

## 2021-07-05 MED ORDER — QUVIVIQ 50 MG PO TABS: 1.0000 | ORAL_TABLET | Freq: Every evening | ORAL | 1 refills | Status: DC
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
## 2021-07-07 DIAGNOSIS — F4321 Adjustment disorder with depressed mood: Secondary | ICD-10-CM | POA: Diagnosis not present

## 2021-07-14 DIAGNOSIS — F4321 Adjustment disorder with depressed mood: Secondary | ICD-10-CM | POA: Diagnosis not present

## 2021-07-15 ENCOUNTER — Telehealth: Payer: Self-pay

## 2021-07-15 NOTE — Telephone Encounter (Signed)
Prior Authorization DENIED for QUVIVIQ, the medication is not on the formulary. The request does not meet the definition of Medical Necessity found in  Pt must try and fail ALL formulary alternatives.   Pt is still able to get this medication through [facility] prescriptions

## 2021-07-17 ENCOUNTER — Other Ambulatory Visit: Payer: Self-pay | Admitting: Psychiatry

## 2021-07-17 DIAGNOSIS — F5105 Insomnia due to other mental disorder: Secondary | ICD-10-CM

## 2021-07-18 DIAGNOSIS — F4321 Adjustment disorder with depressed mood: Secondary | ICD-10-CM | POA: Diagnosis not present

## 2021-07-19 ENCOUNTER — Ambulatory Visit (INDEPENDENT_AMBULATORY_CARE_PROVIDER_SITE_OTHER): Payer: BC Managed Care – PPO | Admitting: Sports Medicine

## 2021-07-19 ENCOUNTER — Other Ambulatory Visit: Payer: Self-pay

## 2021-07-19 VITALS — BP 124/88 | Ht 73.0 in | Wt 236.0 lb

## 2021-07-19 DIAGNOSIS — M4000 Postural kyphosis, site unspecified: Secondary | ICD-10-CM | POA: Diagnosis not present

## 2021-07-19 NOTE — Progress Notes (Signed)
Chief complaint: Poor posture and weakness in upper back muscles  Patient recently got married His wife noted his head forward position and his tendency toward kyphosis He has a family history of kyphosis with his father having fairly significant changes He has had a cervical fusion at C5-C6 Father also had some osteoporosis Patient takes a lot of Tums each day He has done a lot of strength work but mostly for his anterior chest muscles  We have treated him with home exercises several times for scapular stabilization and to try to improve his posture He has not really been that successful with this  However he does not have much pain at present Sometimes after sleeping he will have some pain but he props his neck up on 3 pillows to use CPAP  Review of systems No numbness or tingling in the hands No grip strength weakness  Physical exam Pleasant muscular white male in no acute distress.  He does have a head forward position BP 124/88   Ht '6\' 1"'$  (1.854 m)   Wt 236 lb (107 kg)   BMI 31.14 kg/m  [facility] 09/14/2020  Frequency of aerobic exercise (# of days/week) 3  Average time in minutes 35  Frequency of strengthening activities (# of days/week) 3   Patient has some limitation with extension Good rotation and lateral bending of the neck Normal neurologic testing C5-T1 Normal rotator cuff strength Scapular motion still shows some slight fasciculation

## 2021-07-19 NOTE — Assessment & Plan Note (Signed)
I believe he could certainly improve his posture with some physical therapy exercises Since he has not made progress on home exercise we will refer him to Tomie China, PT  at [facility] physical therapy I would like to work on his periscapular muscles and his posture I would like to see if he is a good candidate for Pilates because he needs something to keep working on this muscle imbalance  I did suggest trying a cervical wedge to improve his sleep position

## 2021-07-22 ENCOUNTER — Other Ambulatory Visit: Payer: Self-pay | Admitting: Psychiatry

## 2021-07-22 DIAGNOSIS — F5105 Insomnia due to other mental disorder: Secondary | ICD-10-CM

## 2021-07-22 NOTE — Telephone Encounter (Signed)
Last filled 4/27 appt on 10/12

## 2021-07-25 DIAGNOSIS — L603 Nail dystrophy: Secondary | ICD-10-CM | POA: Diagnosis not present

## 2021-07-25 DIAGNOSIS — E039 Hypothyroidism, unspecified: Secondary | ICD-10-CM | POA: Diagnosis not present

## 2021-07-25 DIAGNOSIS — L989 Disorder of the skin and subcutaneous tissue, unspecified: Secondary | ICD-10-CM | POA: Diagnosis not present

## 2021-07-25 DIAGNOSIS — E559 Vitamin D deficiency, unspecified: Secondary | ICD-10-CM | POA: Diagnosis not present

## 2021-07-25 DIAGNOSIS — E538 Deficiency of other specified B group vitamins: Secondary | ICD-10-CM | POA: Diagnosis not present

## 2021-07-27 ENCOUNTER — Ambulatory Visit: Payer: BC Managed Care – PPO | Attending: Sports Medicine | Admitting: Physical Therapy

## 2021-07-27 ENCOUNTER — Other Ambulatory Visit: Payer: Self-pay

## 2021-07-27 DIAGNOSIS — R293 Abnormal posture: Secondary | ICD-10-CM | POA: Diagnosis not present

## 2021-07-27 DIAGNOSIS — M436 Torticollis: Secondary | ICD-10-CM | POA: Diagnosis not present

## 2021-07-27 DIAGNOSIS — M4004 Postural kyphosis, thoracic region: Secondary | ICD-10-CM | POA: Diagnosis not present

## 2021-07-28 ENCOUNTER — Encounter: Payer: Self-pay | Admitting: Physical Therapy

## 2021-07-28 NOTE — Therapy (Signed)
[place], [place], [address] Phone: [phone]   Fax:  [phone]  Physical Therapy Evaluation  Patient Details  Name: Joshua Pearson MRN: [id number] Date of Birth: Jun 23, 1966 Referring Provider (PT): Dr Stefanie Libel   Encounter Date: 07/27/2021   PT End of Session - 07/27/21 1009     Visit Number 1    Number of Visits 8    Date for PT Re-Evaluation 09/07/21    Authorization Type BCBS    PT Start Time 1015    PT Stop Time 1100    PT Time Calculation (min) 45 min             History reviewed. No pertinent past medical history.  History reviewed. No pertinent surgical history.  There were no vitals filed for this visit.    Subjective Assessment - 07/27/21 1018     Subjective Patient is referred for postural kyphosis which he states is a chronic, genetic issue.  For several years he has tried to do exercises and continues to work out with [employer].  He has tried Pilates but did not have a good experience.  He has occasional pain in neck and upper back and he knows what aggravates it.   He is very careful with tennis, quick jerking motions. He is trying to improve his posture and prevent the progression of his spinal curvature.    Pertinent History spinal stenosis in cervical spine with fusion 2015.  Periscapular pain .    Limitations Lifting;Other (comment)   sleep, higher level sports   Patient Stated Goals Prevent further posture issues, improve    Currently in Pain? No/denies   not current   Pain Score --   not rated, soreness   Pain Location Scapula    Pain Orientation Right;Medial    Pain Descriptors / Indicators Aching;Tightness    Pain Type Chronic pain    Pain Onset More than a month ago    Pain Frequency Intermittent    Aggravating Factors  overhead, high velocity.contact with UEs    Pain Relieving Factors resting    Multiple Pain Sites No                [facility] PT Assessment -  07/28/21 0001       Assessment   Medical Diagnosis kyphosis    Referring Provider (PT) Dr Stefanie Libel    Prior Therapy Yes      Precautions   Precautions None      Restrictions   Weight Bearing Restrictions No      Balance Screen   Has the patient fallen in the past 6 months No      [place] residence    Living Arrangements Spouse/significant other;Children      Prior Function   Level of Independence Independent    Vocation Full time employment    Production assistant, radio    Leisure be active , hiking, biking , kayaking      Cognition   Overall Cognitive Status Within Functional Limits for tasks assessed      Observation/Other Assessments   Focus on Therapeutic Outcomes (FOTO)  53%      Sensation   Light Touch Appears Intact      Posture/Postural Control   Posture/Postural Control Postural limitations    Postural Limitations Rounded Shoulders;Forward head;Increased thoracic kyphosis    Posture Comments humeal head internally rotated bilateral and scapula abducted from mid line  AROM   Cervical Flexion 35    Cervical Extension 40    Cervical - Right Side Bend 35    Cervical - Left Side Bend 35    Cervical - Right Rotation WFL    Cervical - Left Rotation [facility]      Strength   Right Shoulder Flexion 5/5    Right Shoulder ABduction 5/5    Right Shoulder Horizontal ABduction 4/5    Left Shoulder Flexion 4+/5    Left Shoulder ABduction 4+/5    Left Shoulder Horizontal ABduction 4/5   4-/5     Palpation   Spinal mobility stiffness throughout    Palpation comment soreness in Rt middle trap, rhomboid , knots noted in upper back and shoulders in generally                        Objective measurements completed on examination: See above findings.               PT Education - 07/28/21 0906     Education Details PT/POC, HEP, posture, balance and Pilates    Person(s) Educated Patient     Methods Explanation    Comprehension Verbalized understanding                 PT Long Term Goals - 07/28/21 0914       PT LONG TERM GOAL #1   Title Pt will be able to increase FOTO score to 75% or better    Time 8    Period Weeks    Status New    Target Date 09/22/21      PT LONG TERM GOAL #2   Title Pt will be able to show Independence with HEP for long term postural endurance    Time 8    Period Weeks    Status New    Target Date 09/22/21      PT LONG TERM GOAL #3   Title Pt will be able to demo horizontal abduction strength 5/5 for maximal postural support    Time 8    Period Weeks    Status New    Target Date 09/22/21      PT LONG TERM GOAL #4   Title Pt wil be more aware of neck and shoulder position at the gym and need no cues during lifting in the clinic    Time 8    Period Weeks    Status New    Target Date 09/22/21                    Plan - 07/27/21 1050     Clinical Impression Statement Patient presents with chronic postural abnormalities which have become increasingly evident with age and time. Discussed muscle balance and exercise programming at the gym, Pilates as an alternative to balance and improve postural awareness. He was open to Pilates Reformer/Tower here in PT. He has min shoulder and upper back weakness, forward head position but overall quite fit. HE will do well wth PT intervention to address impairments and provide direction with workouts to maximize results.    Personal Factors and Comorbidities Past/Current Experience    Examination-Activity Limitations Other;Carry;Lift    Examination-Participation Restrictions Community Activity;Interpersonal Relationship    Stability/Clinical Decision Making Stable/Uncomplicated    Clinical Decision Making Low    Rehab Potential Excellent    PT Frequency 1x / week    PT Duration 8 weeks   if needed  PT Treatment/Interventions ADLs/Self Care Home Management;Cryotherapy;Moist  Heat;Functional mobility training;Neuromuscular re-education;Therapeutic activities;Therapeutic exercise;Patient/family education;Manual techniques;Dry needling    PT Next Visit Plan check HEP. Pilates tower, reformer    PT Home Exercise Plan Access Code: [email or web address]  URL: [email or web address]  Date: 07/28/2021  Prepared by: Raeford Razor    Exercises  Standing Shoulder Horizontal Abduction with Resistance - 1 x daily - 7 x weekly - 2 sets - 10 reps - 5 hold  Prone Middle Trapezius Strengthening on Swiss Ball - 1 x daily - 7 x weekly - 2 sets - 10 reps - 5 hold  Corner Pec Major Stretch - 1 x daily - 7 x weekly - 1 sets - 5 reps - 30 hold    Consulted and Agree with Plan of Care Patient             Patient will benefit from skilled therapeutic intervention in order to improve the following deficits and impairments:  Decreased mobility, Decreased range of motion, Pain, Postural dysfunction, Impaired UE functional use, Impaired flexibility, Increased fascial restricitons, Decreased strength  Visit Diagnosis: Abnormal posture  Postural kyphosis of thoracic region  Stiffness of cervical spine     Problem List Patient Active Problem List   Diagnosis Date Noted   Kyphosis (acquired) (postural) 09/14/2020   GAD (generalized anxiety disorder) 11/03/2018   Spinal stenosis in cervical region 09/05/2018   Periscapular pain 08/09/2016    Mary-Anne Polizzi 07/28/2021, 9:24 AM  [place] [facility] [address] [place], [place], [address] Phone: [phone]   Fax:  [phone]  Name: CINCERE GIVAN MRN: [id number] Date of Birth: Jul 26, 1966  Raeford Razor, PT 07/28/21 9:24 AM Phone: [phone] Fax: [phone]

## 2021-07-28 NOTE — Patient Instructions (Signed)
Access Code: [email or web address] URL: [email or web address] Date: 07/28/2021 Prepared by: Raeford Razor  Exercises Standing Shoulder Horizontal Abduction with Resistance - 1 x daily - 7 x weekly - 2 sets - 10 reps - 5 hold Prone Middle Trapezius Strengthening on Swiss Ball - 1 x daily - 7 x weekly - 2 sets - 10 reps - 5 hold Corner Pec Major Stretch - 1 x daily - 7 x weekly - 1 sets - 5 reps - 30 hold  Also Plank vs pec work

## 2021-08-02 ENCOUNTER — Ambulatory Visit: Payer: BC Managed Care – PPO | Admitting: Physical Therapy

## 2021-08-02 ENCOUNTER — Encounter: Payer: Self-pay | Admitting: Physical Therapy

## 2021-08-02 ENCOUNTER — Other Ambulatory Visit: Payer: Self-pay

## 2021-08-02 DIAGNOSIS — M436 Torticollis: Secondary | ICD-10-CM

## 2021-08-02 DIAGNOSIS — M4004 Postural kyphosis, thoracic region: Secondary | ICD-10-CM | POA: Diagnosis not present

## 2021-08-02 DIAGNOSIS — R293 Abnormal posture: Secondary | ICD-10-CM | POA: Diagnosis not present

## 2021-08-02 NOTE — Therapy (Signed)
[place], [place], [address] Phone: [phone]   Fax:  [phone]  Physical Therapy Treatment  Patient Details  Name: RAGEN MACARTHUR MRN: [id number] Date of Birth: February 07, 1966 Referring Provider (PT): Dr Stefanie Libel   Encounter Date: 08/02/2021   PT End of Session - 08/02/21 0936     Visit Number 2    Number of Visits 8    Date for PT Re-Evaluation 09/07/21    Authorization Type BCBS    PT Start Time 0935    PT Stop Time [id number]    PT Time Calculation (min) 39 min    Activity Tolerance Patient tolerated treatment well    Behavior During Therapy [facility] for tasks assessed/performed             History reviewed. No pertinent past medical history.  History reviewed. No pertinent surgical history.  There were no vitals filed for this visit.      [facility] Adult PT Treatment/Exercise - 08/02/21 0001       Pilates   Pilates Tower see note    Pilates Mat single arm horizontal abd x 8 each side in Q-ped, prone T x 10    Other Pilates Standing horizontal pull            Pilates Tower for LE/Core strength, postural strength, lumbopelvic disassociation and core control.  Exercises included:  Push thru bar mobility  x 5   [place] yellow spring arcs and T arcs, x 10, cues for reducing tension in neck and shoulders , tends to intermally rotate through shoulders.   Bridging x 10, cues for articulation   Added arm press x 10 to bridge  Chest expansion/thigh stretch yellow with roll down x 8 each , hinge x 10   1/2 kneeling upper trunk rotation x 10 each side  Tall kneeling horizontal abd x 10 and ER with slastix crossed x 10 (goal post)  Thread the needle  x 2 each side for thoracic rotation/post shoulder           PT Long Term Goals - 07/28/21 0914       PT LONG TERM GOAL #1   Title Pt will be able to increase FOTO score to 75% or better    Time 8    Period Weeks    Status New     Target Date 09/22/21      PT LONG TERM GOAL #2   Title Pt will be able to show Independence with HEP for long term postural endurance    Time 8    Period Weeks    Status New    Target Date 09/22/21      PT LONG TERM GOAL #3   Title Pt will be able to demo horizontal abduction strength 5/5 for maximal postural support    Time 8    Period Weeks    Status New    Target Date 09/22/21      PT LONG TERM GOAL #4   Title Pt wil be more aware of neck and shoulder position at the gym and need no cues during lifting in the clinic    Time 8    Period Weeks    Status New    Target Date 09/22/21                   Plan - 08/02/21 [id number]     Clinical Impression Statement Worked on post chain and core  strength using Pilates Tower springs and slastix.  Weakness evident in upper back, compensates with upper traps a good amount but did realize this and make adjustments when in tall kneeling, seated.  This form of Pilates was different than has previous experience.  No increased pain during session.    PT Treatment/Interventions ADLs/Self Care Home Management;Cryotherapy;Moist Heat;Functional mobility training;Neuromuscular re-education;Therapeutic activities;Therapeutic exercise;Patient/family education;Manual techniques;Dry needling    PT Next Visit Plan HEP. Pilates tower, reformer: plank,reverse abs, lower abs/supine arms, tall kneeling diagonals    PT Home Exercise Plan Access Code: [email or web address]  URL: [email or web address]  Date: 07/28/2021  Prepared by: Raeford Razor    Exercises  Standing Shoulder Horizontal Abduction with Resistance - 1 x daily - 7 x weekly - 2 sets - 10 reps - 5 hold  Prone Middle Trapezius Strengthening on Swiss Ball - 1 x daily - 7 x weekly - 2 sets - 10 reps - 5 hold  Corner Pec Major Stretch - 1 x daily - 7 x weekly - 1 sets - 5 reps - 30 hold    Consulted and Agree with Plan of Care Patient             Patient will benefit from skilled therapeutic  intervention in order to improve the following deficits and impairments:  Decreased mobility, Decreased range of motion, Pain, Postural dysfunction, Impaired UE functional use, Impaired flexibility, Increased fascial restricitons, Decreased strength  Visit Diagnosis: Abnormal posture  Stiffness of cervical spine  Postural kyphosis of thoracic region     Problem List Patient Active Problem List   Diagnosis Date Noted   Kyphosis (acquired) (postural) 09/14/2020   GAD (generalized anxiety disorder) 11/03/2018   Spinal stenosis in cervical region 09/05/2018   Periscapular pain 08/09/2016    Khambrel Amsden 08/02/2021, 12:07 PM  [place] [facility] [address] [place], [place], [address] Phone: [phone]   Fax:  [phone]  Name: ROBEL POTTHOFF MRN: [id number] Date of Birth: 1966-07-31   Raeford Razor, PT 08/02/21 12:09 PM Phone: [phone] Fax: [phone]

## 2021-08-04 ENCOUNTER — Encounter: Payer: Self-pay | Admitting: Physical Therapy

## 2021-08-04 ENCOUNTER — Ambulatory Visit: Payer: BC Managed Care – PPO | Admitting: Physical Therapy

## 2021-08-04 ENCOUNTER — Other Ambulatory Visit: Payer: Self-pay

## 2021-08-04 DIAGNOSIS — M436 Torticollis: Secondary | ICD-10-CM

## 2021-08-04 DIAGNOSIS — R293 Abnormal posture: Secondary | ICD-10-CM

## 2021-08-04 DIAGNOSIS — F4321 Adjustment disorder with depressed mood: Secondary | ICD-10-CM | POA: Diagnosis not present

## 2021-08-04 DIAGNOSIS — M4004 Postural kyphosis, thoracic region: Secondary | ICD-10-CM | POA: Diagnosis not present

## 2021-08-04 NOTE — Therapy (Addendum)
[place], [place], [address] Phone: [phone]   Fax:  [phone]  Physical Therapy Treatment/Discharge  Patient Details  Name: JELAN BATTERTON MRN: [id number] Date of Birth: [birth year] Referring Provider (PT): Dr Stefanie Libel   Encounter Date: 08/04/2021   PT End of Session - 08/04/21 0815     Visit Number 3    Number of Visits 8    Date for PT Re-Evaluation 09/07/21    Authorization Type BCBS    PT Start Time 0800    PT Stop Time 0847    PT Time Calculation (min) 47 min    Activity Tolerance Patient tolerated treatment well    Behavior During Therapy [facility] for tasks assessed/performed             History reviewed. No pertinent past medical history.  History reviewed. No pertinent surgical history.  There were no vitals filed for this visit.   Subjective Assessment - 08/04/21 0814     Subjective I hurt my foot yesterday (achilles)  No pain in shoulder or neck today.    Currently in Pain? No/denies                Pilates Reformer used for LE/core strength, postural strength, lumbopelvic disassociation and core control.  Exercises included:  Footwork 2 red 1 blue 1 yellow   Parallel on heels, then on toes.  Cue for full extension through knees and controlled flexion, about 20 reps each   Heel raises x 10 and stretching each gastroc/achilles  x 3    Supine Arm 1 red 1 yellow   Arcs in parallel and then in T x 10 , min cues for maintaining table top   Reverse Abdominals 1 Red UE x 10 then added LE x 10  Long box Prone Overhead press 1 red double press, triceps and then single arm x 10 each   Pulling straps 1 red   x 10  Horizontal abd x 10         [facility] Adult PT Treatment/Exercise - 08/04/21 0001       Self-Care   Self-Care Other Self-Care Comments;Posture    Posture foam roller    Other Self-Care Comments  Pilates, alignment throughout sesson      Lumbar Exercises:  Stretches   Other Lumbar Stretch Exercise thoracic mobility 3 levels on foam roller                         PT Long Term Goals - 07/28/21 0914       PT LONG TERM GOAL #1   Title Pt will be able to increase FOTO score to 75% or better    Time 8    Period Weeks    Status New    Target Date 09/22/21      PT LONG TERM GOAL #2   Title Pt will be able to show Independence with HEP for long term postural endurance    Time 8    Period Weeks    Status New    Target Date 09/22/21      PT LONG TERM GOAL #3   Title Pt will be able to demo horizontal abduction strength 5/5 for maximal postural support    Time 8    Period Weeks    Status New    Target Date 09/22/21      PT LONG TERM GOAL #4   Title Pt wil  be more aware of neck and shoulder position at the gym and need no cues during lifting in the clinic    Time 8    Period Weeks    Status New    Target Date 09/22/21                   Plan - 08/04/21 1517     Clinical Impression Statement Patient able to exercise utilizing pilates equipment without increasing pain .  He needs cues for head position and scapular adduction, downward rotation with prone.  Reports feeling low back and core throughout session.  Showed him how to use foam roller to mobilize T spine and improve posture.  He is already doing some of the foam roller at the gym.    Stability/Clinical Decision Making Stable/Uncomplicated    PT Treatment/Interventions ADLs/Self Care Home Management;Cryotherapy;Moist Heat;Functional mobility training;Neuromuscular re-education;Therapeutic activities;Therapeutic exercise;Patient/family education;Manual techniques;Dry needling    PT Next Visit Plan HEP. Pilates tower, reformer: plank,reverse abs, lower abs/supine arms, tall kneeling diagonals    PT Home Exercise Plan Access Code: [email or web address]  URL: [email or web address]  Date: 07/28/2021  Prepared by: Raeford Razor    Exercises  Standing Shoulder  Horizontal Abduction with Resistance - 1 x daily - 7 x weekly - 2 sets - 10 reps - 5 hold  Prone Middle Trapezius Strengthening on Swiss Ball - 1 x daily - 7 x weekly - 2 sets - 10 reps - 5 hold  Corner Pec Major Stretch - 1 x daily - 7 x weekly - 1 sets - 5 reps - 30 hold    Consulted and Agree with Plan of Care Patient             Patient will benefit from skilled therapeutic intervention in order to improve the following deficits and impairments:  Decreased mobility, Decreased range of motion, Pain, Postural dysfunction, Impaired UE functional use, Impaired flexibility, Increased fascial restricitons, Decreased strength  Visit Diagnosis: Abnormal posture  Stiffness of cervical spine  Postural kyphosis of thoracic region     Problem List Patient Active Problem List   Diagnosis Date Noted   Kyphosis (acquired) (postural) 09/14/2020   GAD (generalized anxiety disorder) 11/03/2018   Spinal stenosis in cervical region 09/05/2018   Periscapular pain 08/09/2016    Moira Umholtz 08/04/2021, 9:07 AM  [place], [place], [address] Phone: [phone]   Fax:  [phone]  Name: DRAYLEN LOBUE MRN: [id number] Date of Birth: 10-Jul-1966   Raeford Razor, PT 08/04/21 9:10 AM Phone: [phone] Fax: [phone]   PHYSICAL THERAPY DISCHARGE SUMMARY  Visits from Start of Care: 3  Current functional level related to goals / functional outcomes: See above    Remaining deficits: See above    Education / Equipment: HEP, posture, Pilates   Patient agrees to discharge. Patient goals were not met. Patient is being discharged due to not returning since the last visit. Patient left the country as he had a death in the family and did not return to PT>    Raeford Razor, PT 11/24/21 8:38 AM Phone: [phone] Fax: [phone]

## 2021-08-08 DIAGNOSIS — G4733 Obstructive sleep apnea (adult) (pediatric): Secondary | ICD-10-CM | POA: Diagnosis not present

## 2021-08-09 ENCOUNTER — Encounter: Payer: BC Managed Care – PPO | Admitting: Physical Therapy

## 2021-08-09 ENCOUNTER — Ambulatory Visit: Payer: BC Managed Care – PPO

## 2021-08-12 ENCOUNTER — Ambulatory Visit: Payer: BC Managed Care – PPO | Admitting: Physical Therapy

## 2021-08-16 ENCOUNTER — Ambulatory Visit: Payer: BC Managed Care – PPO | Admitting: Physical Therapy

## 2021-08-22 ENCOUNTER — Encounter: Payer: BC Managed Care – PPO | Admitting: Physical Therapy

## 2021-08-24 ENCOUNTER — Encounter: Payer: BC Managed Care – PPO | Admitting: Physical Therapy

## 2021-08-25 DIAGNOSIS — F4321 Adjustment disorder with depressed mood: Secondary | ICD-10-CM | POA: Diagnosis not present

## 2021-09-15 DIAGNOSIS — F4321 Adjustment disorder with depressed mood: Secondary | ICD-10-CM | POA: Diagnosis not present

## 2021-09-19 DIAGNOSIS — G4733 Obstructive sleep apnea (adult) (pediatric): Secondary | ICD-10-CM | POA: Diagnosis not present

## 2021-09-21 ENCOUNTER — Other Ambulatory Visit: Payer: Self-pay | Admitting: Psychiatry

## 2021-09-21 DIAGNOSIS — F5105 Insomnia due to other mental disorder: Secondary | ICD-10-CM

## 2021-09-27 DIAGNOSIS — R3915 Urgency of urination: Secondary | ICD-10-CM | POA: Diagnosis not present

## 2021-09-27 DIAGNOSIS — G40209 Localization-related (focal) (partial) symptomatic epilepsy and epileptic syndromes with complex partial seizures, not intractable, without status epilepticus: Secondary | ICD-10-CM | POA: Diagnosis not present

## 2021-09-27 DIAGNOSIS — N138 Other obstructive and reflux uropathy: Secondary | ICD-10-CM | POA: Diagnosis not present

## 2021-09-27 DIAGNOSIS — E291 Testicular hypofunction: Secondary | ICD-10-CM | POA: Diagnosis not present

## 2021-09-27 DIAGNOSIS — R419 Unspecified symptoms and signs involving cognitive functions and awareness: Secondary | ICD-10-CM | POA: Diagnosis not present

## 2021-09-27 DIAGNOSIS — N401 Enlarged prostate with lower urinary tract symptoms: Secondary | ICD-10-CM | POA: Diagnosis not present

## 2021-09-27 DIAGNOSIS — N5201 Erectile dysfunction due to arterial insufficiency: Secondary | ICD-10-CM | POA: Diagnosis not present

## 2021-09-30 DIAGNOSIS — F4321 Adjustment disorder with depressed mood: Secondary | ICD-10-CM | POA: Diagnosis not present

## 2021-10-04 DIAGNOSIS — R739 Hyperglycemia, unspecified: Secondary | ICD-10-CM | POA: Diagnosis not present

## 2021-10-04 DIAGNOSIS — E781 Pure hyperglyceridemia: Secondary | ICD-10-CM | POA: Diagnosis not present

## 2021-10-04 DIAGNOSIS — R632 Polyphagia: Secondary | ICD-10-CM | POA: Diagnosis not present

## 2021-10-04 DIAGNOSIS — E669 Obesity, unspecified: Secondary | ICD-10-CM | POA: Diagnosis not present

## 2021-10-05 ENCOUNTER — Other Ambulatory Visit: Payer: Self-pay

## 2021-10-05 ENCOUNTER — Ambulatory Visit (INDEPENDENT_AMBULATORY_CARE_PROVIDER_SITE_OTHER): Payer: BC Managed Care – PPO | Admitting: Psychiatry

## 2021-10-05 ENCOUNTER — Encounter: Payer: Self-pay | Admitting: Psychiatry

## 2021-10-05 DIAGNOSIS — G4733 Obstructive sleep apnea (adult) (pediatric): Secondary | ICD-10-CM

## 2021-10-05 DIAGNOSIS — F900 Attention-deficit hyperactivity disorder, predominantly inattentive type: Secondary | ICD-10-CM | POA: Diagnosis not present

## 2021-10-05 DIAGNOSIS — F5105 Insomnia due to other mental disorder: Secondary | ICD-10-CM | POA: Diagnosis not present

## 2021-10-05 DIAGNOSIS — F3342 Major depressive disorder, recurrent, in full remission: Secondary | ICD-10-CM

## 2021-10-05 DIAGNOSIS — F411 Generalized anxiety disorder: Secondary | ICD-10-CM

## 2021-10-05 DIAGNOSIS — F09 Unspecified mental disorder due to known physiological condition: Secondary | ICD-10-CM

## 2021-10-05 DIAGNOSIS — F5081 Binge eating disorder: Secondary | ICD-10-CM

## 2021-10-05 MED ORDER — DAYVIGO 10 MG PO TABS: ORAL_TABLET | ORAL | 4 refills | Status: DC
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
## 2021-10-12 ENCOUNTER — Other Ambulatory Visit: Payer: Self-pay | Admitting: Psychiatry

## 2021-10-13 DIAGNOSIS — F4321 Adjustment disorder with depressed mood: Secondary | ICD-10-CM | POA: Diagnosis not present

## 2021-10-24 DIAGNOSIS — G4733 Obstructive sleep apnea (adult) (pediatric): Secondary | ICD-10-CM | POA: Diagnosis not present

## 2021-10-27 DIAGNOSIS — F4321 Adjustment disorder with depressed mood: Secondary | ICD-10-CM | POA: Diagnosis not present

## 2021-11-16 DIAGNOSIS — E569 Vitamin deficiency, unspecified: Secondary | ICD-10-CM | POA: Diagnosis not present

## 2021-11-16 DIAGNOSIS — R7303 Prediabetes: Secondary | ICD-10-CM | POA: Diagnosis not present

## 2021-11-16 DIAGNOSIS — R635 Abnormal weight gain: Secondary | ICD-10-CM | POA: Diagnosis not present

## 2021-11-16 DIAGNOSIS — E559 Vitamin D deficiency, unspecified: Secondary | ICD-10-CM | POA: Diagnosis not present

## 2021-11-16 DIAGNOSIS — E785 Hyperlipidemia, unspecified: Secondary | ICD-10-CM | POA: Diagnosis not present

## 2021-11-16 DIAGNOSIS — D751 Secondary polycythemia: Secondary | ICD-10-CM | POA: Diagnosis not present

## 2021-11-22 DIAGNOSIS — G4733 Obstructive sleep apnea (adult) (pediatric): Secondary | ICD-10-CM | POA: Diagnosis not present

## 2021-11-22 DIAGNOSIS — R635 Abnormal weight gain: Secondary | ICD-10-CM | POA: Diagnosis not present

## 2021-11-22 DIAGNOSIS — Z6831 Body mass index (BMI) 31.0-31.9, adult: Secondary | ICD-10-CM | POA: Diagnosis not present

## 2021-11-22 DIAGNOSIS — E786 Lipoprotein deficiency: Secondary | ICD-10-CM | POA: Diagnosis not present

## 2021-11-23 ENCOUNTER — Other Ambulatory Visit: Payer: Self-pay | Admitting: Psychiatry

## 2021-11-23 DIAGNOSIS — F5105 Insomnia due to other mental disorder: Secondary | ICD-10-CM

## 2021-11-24 DIAGNOSIS — F4321 Adjustment disorder with depressed mood: Secondary | ICD-10-CM | POA: Diagnosis not present

## 2021-11-25 DIAGNOSIS — G4733 Obstructive sleep apnea (adult) (pediatric): Secondary | ICD-10-CM | POA: Diagnosis not present

## 2021-11-29 ENCOUNTER — Ambulatory Visit (INDEPENDENT_AMBULATORY_CARE_PROVIDER_SITE_OTHER): Payer: BC Managed Care – PPO | Admitting: Sports Medicine

## 2021-11-29 ENCOUNTER — Ambulatory Visit: Payer: Self-pay

## 2021-11-29 VITALS — Ht 73.0 in | Wt 232.0 lb

## 2021-11-29 DIAGNOSIS — M79632 Pain in left forearm: Secondary | ICD-10-CM | POA: Diagnosis not present

## 2021-11-29 DIAGNOSIS — S46212A Strain of muscle, fascia and tendon of other parts of biceps, left arm, initial encounter: Secondary | ICD-10-CM

## 2021-11-29 NOTE — Patient Instructions (Signed)
It was great to meet you today, thank you for letting me participate in your care.  Today, we discussed that you have a partial tear (likely 10-15% of the tendon) of your distal biceps muscle.   Things for you to do:  - avoid bicep heavy lifting, any activity with your hand facing the ceiling will aggravate this. Avoid this heavy lifting x 6 weeks - begin gentle bicep exercises and rehab exercises with light weight 5-10 lbs for the left arm - Give this about 6 weeks before you start slowly returning to normal weight. As you do this, make sure it is not painful when you increase in weight - You may use ice or Ibuprofen for any pain in the area  You will follow-up in about 6 weeks if you are still having pain or issues with the arm, otherwise just as needed.  If you have any further questions, please give the clinic a call [phone].  Cheers,  Elba Barman, [place]

## 2021-11-29 NOTE — Progress Notes (Signed)
PCP: Shirline Frees, MD  Subjective:   HPI: Patient is a 55 y.o. male here for evaluation of left arm/elbow pain.  Joshua Pearson is a pleasant male who is very active in working out at the gym.  He presents today with about 4-6 weeks with pain in the proximal forearm and elbow, over the radial side.  He denies any specific event or inciting injury.  He does state that he works in at [employer] about 6 days a week.  He has noticed the pain with curling exercises or with overhead pulldown with his hand in a supinated position.  At rest, he has no pain.  However he does note that he has pain and that his left arm is weaker with the certain activities.  He has tried modifying his activity by using slightly less weight.  He does take ibuprofen and naproxen only as needed, but he does not have any pain following activity, only with his workouts.  He denies feeling any pop or bulge of the bicep tendon.  No pain in the shoulder.  He denies any radicular symptoms or loss of grip strength.  Does report a history of microdiscectomy in the cervical spine in 2017.  No past medical history on file.  Current Outpatient Medications on File Prior to Visit  Medication Sig Dispense Refill   Cholecalciferol 125 MCG (5000 UT) TABS Take by mouth.     DAYVIGO 10 MG TABS TAKE 1 TABLET BY MOUTH EVERYDAY AT BEDTIME 30 tablet 5   DENTA 5000 PLUS 1.1 % CREA dental cream      L-Methylfolate 15 MG TABS Take 1 tablet (15 mg total) by mouth daily. 90 tablet 3   lansoprazole (PREVACID) 15 MG capsule Take by mouth. 2 daily     levETIRAcetam (KEPPRA) 750 MG tablet Take 750 mg by mouth 2 (two) times daily.     levothyroxine (SYNTHROID, LEVOTHROID) 75 MCG tablet      lisdexamfetamine (VYVANSE) 20 MG capsule Take by mouth in the morning and at bedtime.     loratadine (CLARITIN) 10 MG tablet Take by mouth.     LORazepam (ATIVAN) 0.5 MG tablet TAKE 1 TABLET BY MOUTH EVERY DAY AS NEEDED FOR ANXIETY 30 tablet 0   Magnesium 500 MG CAPS Take 500  mg by mouth.     Melatonin 5 MG CAPS Take 10 mg by mouth.  (Patient not taking: Reported on 07/27/2021)     mirabegron ER (MYRBETRIQ) 50 MG TB24 tablet Take 50 mg by mouth daily.     naproxen sodium (ALEVE) 220 MG tablet Take by mouth.     Phentermine HCl (LOMAIRA) 8 MG TABS Take by mouth. Take 1/2 tablet at 4pm     Phosphatidylserine 100 MG CAPS Take 1 capsule by mouth daily.     tadalafil (CIALIS) 5 MG tablet Take 5 mg by mouth at bedtime.     Testosterone Undecanoate (JATENZO) 237 MG CAPS Take 1 capsule by mouth in the morning and at bedtime.     Vilazodone HCl (VIIBRYD) 10 MG TABS TAKE 1 TABLET BY MOUTH EVERY DAY 30 tablet 3   vitamin B-12 (CYANOCOBALAMIN) 500 MCG tablet Take 5,000 mcg by mouth daily.     No current facility-administered medications on file prior to visit.    No past surgical history on file.  Allergies  Allergen Reactions   Doxycycline Anaphylaxis   Aspirin Other (See Comments)    dizziness   Gabapentin    Lyrica [Pregabalin]  Prednisone    Topamax [Topiramate]     Ht 6\' 1"  (1.854 m)   Wt 232 lb (105.2 kg)   BMI 30.61 kg/m   [facility] 09/14/2020 11/29/2021  Frequency of aerobic exercise (# of days/week) 3 5  Average time in minutes 35 45  Frequency of strengthening activities (# of days/week) 3 3    No flowsheet data found.      Objective:  Physical Exam:  Gen: Well-appearing, in no acute distress; non-toxic CV: Regular Rate. Well-perfused. Warm.  Resp: Breathing unlabored on room air; no wheezing. Psych: Fluid speech in conversation; appropriate affect; normal thought process Neuro: Sensation intact throughout. No gross coordination deficits.  MSK:  - Left elbow/arm: + TTP over distal biceps insertion. No redness, warmth, or ecchymosis.  There is no TTP within the bicipital groove or at the coracoid range of motion is full in flexion and extension at the elbow.  There is pain with resisted flexion of the arm.  4/5  strength with resisted flexion secondary to pain, otherwise 5/5 strength of the left upper extremity.  Radial artery 2/4. Provocative testing: + Speed's test, + Pain with Yergason's test, but bicep tendon does not pop in/out of bicipital groove.  Negative hook test.  MSK Limited biceps tendon ultrasound performed, left  -Evaluation of the biceps tendon within the humeral groove was identified in both short and long axis without evidence of tearing or surrounding hypoechoic fluid -Biceps tendon was scanned distally and long axis into the myotendinous junction without pathology -Biceps tendon was then evaluated at the distal insertion site utilizing a Cobra view.  In this view the distal insertion of the bicep tendon does appear intact without gross tearing, although there is hypoechoic fluid suggestive of edema.  There is a very small degree, possibly 10-15% of focal tearing at the insertion site.  Mild hyperemia of this area.    IMPRESSION: Very mild degree of distal biceps partial tearing with surrounding edema without evidence of gross tear or rupture.  Ultrasound and interpretation by Dr. Rolena Infante and Joshua Pearson. Fields, MD    Assessment & Plan:  1. Left forearm pain 2. Distal biceps tendon - partial tear with surrounding edema  Patient has a strain with a small degree of partial tearing of the distal biceps tendon noted on ultrasound.  This is likely from overuse injury given his avid workouts.  The integrity of the tendon does appear intact without evidence of rupture.  -Discussed importance of activity modification over the next 6 weeks, avoiding provocative workouts and activity -We will begin home exercises for bicep rehab, limiting weight to 5-10 pounds for his left arm.  Handouts were provided and these were demonstrated for the patient today -He may use ice or ibuprofen for any residual pain in the area -At around the 6-week mark, he may start slow return to normalize weight both arms, if  he still has residual pain he may follow-up otherwise only as needed  Joshua Barman, DO PGY-4, Sports Medicine Fellow Joshua Pearson  I observed and examined the patient with the [facility] resident and agree with assessment and plan.  Note reviewed and modified by me. Ila Mcgill, MD

## 2021-12-05 DIAGNOSIS — E291 Testicular hypofunction: Secondary | ICD-10-CM | POA: Diagnosis not present

## 2021-12-07 ENCOUNTER — Other Ambulatory Visit: Payer: Self-pay | Admitting: Psychiatry

## 2021-12-07 DIAGNOSIS — F5105 Insomnia due to other mental disorder: Secondary | ICD-10-CM

## 2021-12-07 NOTE — Telephone Encounter (Signed)
Filled 05/31/20 appt on 02/06/22

## 2021-12-10 DIAGNOSIS — U071 COVID-19: Secondary | ICD-10-CM | POA: Diagnosis not present

## 2022-02-06 ENCOUNTER — Ambulatory Visit (INDEPENDENT_AMBULATORY_CARE_PROVIDER_SITE_OTHER): Payer: BC Managed Care – PPO | Admitting: Psychiatry

## 2022-02-06 ENCOUNTER — Other Ambulatory Visit: Payer: Self-pay

## 2022-02-06 ENCOUNTER — Encounter: Payer: Self-pay | Admitting: Psychiatry

## 2022-02-06 DIAGNOSIS — F900 Attention-deficit hyperactivity disorder, predominantly inattentive type: Secondary | ICD-10-CM | POA: Diagnosis not present

## 2022-02-06 DIAGNOSIS — F3342 Major depressive disorder, recurrent, in full remission: Secondary | ICD-10-CM

## 2022-02-06 DIAGNOSIS — F09 Unspecified mental disorder due to known physiological condition: Secondary | ICD-10-CM

## 2022-02-06 DIAGNOSIS — F411 Generalized anxiety disorder: Secondary | ICD-10-CM

## 2022-02-06 DIAGNOSIS — F5081 Binge eating disorder: Secondary | ICD-10-CM

## 2022-02-06 DIAGNOSIS — G4733 Obstructive sleep apnea (adult) (pediatric): Secondary | ICD-10-CM

## 2022-02-06 DIAGNOSIS — F5105 Insomnia due to other mental disorder: Secondary | ICD-10-CM | POA: Diagnosis not present

## 2022-02-06 MED ORDER — VILAZODONE HCL 10 MG PO TABS: 10.0000 mg | ORAL_TABLET | Freq: Every day | ORAL | 0 refills | Status: DC
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
## 2022-02-08 ENCOUNTER — Other Ambulatory Visit: Payer: Self-pay | Admitting: Psychiatry

## 2022-02-08 DIAGNOSIS — F411 Generalized anxiety disorder: Secondary | ICD-10-CM

## 2022-02-08 DIAGNOSIS — F3342 Major depressive disorder, recurrent, in full remission: Secondary | ICD-10-CM

## 2022-02-21 ENCOUNTER — Other Ambulatory Visit (HOSPITAL_COMMUNITY): Payer: Self-pay

## 2022-02-21 MED ORDER — MOUNJARO 7.5 MG/0.5ML ~~LOC~~ SOAJ
7.5000 mg | SUBCUTANEOUS | 1 refills | Status: DC
Start: 2022-02-20 — End: 2023-08-28
  Filled 2022-02-21 (×2): qty 2, 28d supply, fill #0
  Filled 2022-02-27 – 2022-03-29 (×3): qty 2, 28d supply, fill #1

## 2022-02-27 ENCOUNTER — Other Ambulatory Visit (HOSPITAL_COMMUNITY): Payer: Self-pay

## 2022-03-01 ENCOUNTER — Ambulatory Visit
Admission: RE | Admit: 2022-03-01 | Discharge: 2022-03-01 | Disposition: A | Discharge status: Home / Self Care | Payer: BC Managed Care – PPO | Source: Ambulatory Visit | Attending: Physician Assistant | Admitting: Physician Assistant

## 2022-03-01 ENCOUNTER — Other Ambulatory Visit: Payer: Self-pay

## 2022-03-01 ENCOUNTER — Other Ambulatory Visit: Payer: Self-pay | Admitting: Physician Assistant

## 2022-03-01 DIAGNOSIS — R222 Localized swelling, mass and lump, trunk: Secondary | ICD-10-CM

## 2022-03-02 ENCOUNTER — Other Ambulatory Visit: Payer: Self-pay | Admitting: Family Medicine

## 2022-03-02 ENCOUNTER — Ambulatory Visit
Admission: RE | Admit: 2022-03-02 | Discharge: 2022-03-02 | Disposition: A | Discharge status: Home / Self Care | Payer: BC Managed Care – PPO | Source: Ambulatory Visit | Attending: Family Medicine | Admitting: Family Medicine

## 2022-03-02 DIAGNOSIS — R9389 Abnormal findings on diagnostic imaging of other specified body structures: Secondary | ICD-10-CM

## 2022-03-02 MED ORDER — IOPAMIDOL (ISOVUE-300) INJECTION 61%
75.0000 mL | Freq: Once | INTRAVENOUS | Status: AC | PRN
  Administered 2022-03-02: 75 mL via INTRAVENOUS

## 2022-03-03 ENCOUNTER — Other Ambulatory Visit: Payer: Self-pay | Admitting: Psychiatry

## 2022-03-03 DIAGNOSIS — F5105 Insomnia due to other mental disorder: Secondary | ICD-10-CM

## 2022-03-07 ENCOUNTER — Other Ambulatory Visit (HOSPITAL_COMMUNITY): Payer: Self-pay

## 2022-03-07 MED ORDER — MOUNJARO 7.5 MG/0.5ML ~~LOC~~ SOAJ
7.5000 mg | SUBCUTANEOUS | 1 refills | Status: DC
  Filled 2022-03-07 – 2022-03-13 (×2): qty 2, 28d supply, fill #0
  Filled 2022-05-23: qty 2, 28d supply, fill #1

## 2022-03-13 ENCOUNTER — Other Ambulatory Visit (HOSPITAL_COMMUNITY): Payer: Self-pay

## 2022-03-15 ENCOUNTER — Other Ambulatory Visit (HOSPITAL_COMMUNITY): Payer: Self-pay

## 2022-03-16 ENCOUNTER — Other Ambulatory Visit (HOSPITAL_COMMUNITY): Payer: Self-pay

## 2022-03-21 ENCOUNTER — Other Ambulatory Visit: Payer: Self-pay | Admitting: Psychiatry

## 2022-03-21 DIAGNOSIS — F3342 Major depressive disorder, recurrent, in full remission: Secondary | ICD-10-CM

## 2022-03-29 ENCOUNTER — Other Ambulatory Visit (HOSPITAL_COMMUNITY): Payer: Self-pay

## 2022-04-03 ENCOUNTER — Other Ambulatory Visit (HOSPITAL_COMMUNITY): Payer: Self-pay

## 2022-04-06 ENCOUNTER — Other Ambulatory Visit (HOSPITAL_COMMUNITY): Payer: Self-pay

## 2022-04-11 ENCOUNTER — Other Ambulatory Visit (HOSPITAL_COMMUNITY): Payer: Self-pay

## 2022-04-13 ENCOUNTER — Ambulatory Visit (INDEPENDENT_AMBULATORY_CARE_PROVIDER_SITE_OTHER): Payer: BC Managed Care – PPO | Admitting: Sports Medicine

## 2022-04-13 DIAGNOSIS — D167 Benign neoplasm of ribs, sternum and clavicle: Secondary | ICD-10-CM | POA: Insufficient documentation

## 2022-04-13 DIAGNOSIS — M4802 Spinal stenosis, cervical region: Secondary | ICD-10-CM

## 2022-04-13 DIAGNOSIS — M898X1 Other specified disorders of bone, shoulder: Secondary | ICD-10-CM

## 2022-04-13 MED ORDER — METHOCARBAMOL 500 MG PO TABS: 500.0000 mg | ORAL_TABLET | Freq: Four times a day (QID) | ORAL | 2 refills | Status: AC | PRN

## 2022-04-13 NOTE — Assessment & Plan Note (Signed)
If radiating pain returns we will need to reassess and see if he is now having some problems in adjacent C5/6 level ?

## 2022-04-13 NOTE — Progress Notes (Signed)
Left forearm pain ? ?Dx on visit in Dec. Seemed consistent with partial distal biceps tear ?Has done light weight exercise based rehab at home ?Now no pain ?Feels left arm strength is excellent ? ?More concerned with sharp pain to RT scapula ?Similar to pain when he had C6/7 fusion ?Intermittent and no weakness ? ?Anterior chest mass RT 4th rib ?Under evaluation at [facility] ?Osteosarcoma - for removal early May ? ?PE ?Strong W M in NAD ?BP 120/72   Ht '6\' 1"'$  (1.854 m)   Wt 210 lb (95.3 kg)   BMI 27.71 kg/m?  ? ?STill has head forward and shoulder protraction bilat ?Better than before ?On repeat abduction he gets fasiculations near his RT scapula ?If I reposition his scapula these resolve ?Neck motion is tight but only limited in extension ?Strength is good ? ?Anterior RT chest at T4 there is a hard, non-movable mass 3 to 4 cms ? ?Biceps strength is good ?

## 2022-04-13 NOTE — Assessment & Plan Note (Signed)
This is very positional and radiates from his neck ? ?Work on posture ?Modify perscapular MM strength rehab to pain free exercises ?Robaxin as needed (this has helped) ? ?After osteosarcoma surgery we need to revisit his home exercise plan ?

## 2022-04-20 ENCOUNTER — Other Ambulatory Visit (HOSPITAL_COMMUNITY): Payer: Self-pay

## 2022-05-09 ENCOUNTER — Other Ambulatory Visit: Payer: Self-pay | Admitting: Psychiatry

## 2022-05-09 DIAGNOSIS — F411 Generalized anxiety disorder: Secondary | ICD-10-CM

## 2022-05-09 DIAGNOSIS — F3342 Major depressive disorder, recurrent, in full remission: Secondary | ICD-10-CM

## 2022-05-20 IMAGING — CR DG CHEST 2V
3 series · 3 of 3 positions shown · non-contrast
Comparison: None.

CLINICAL DATA: Chest wall mass.

EXAM:
CHEST - 2 VIEW

[w chest pa]
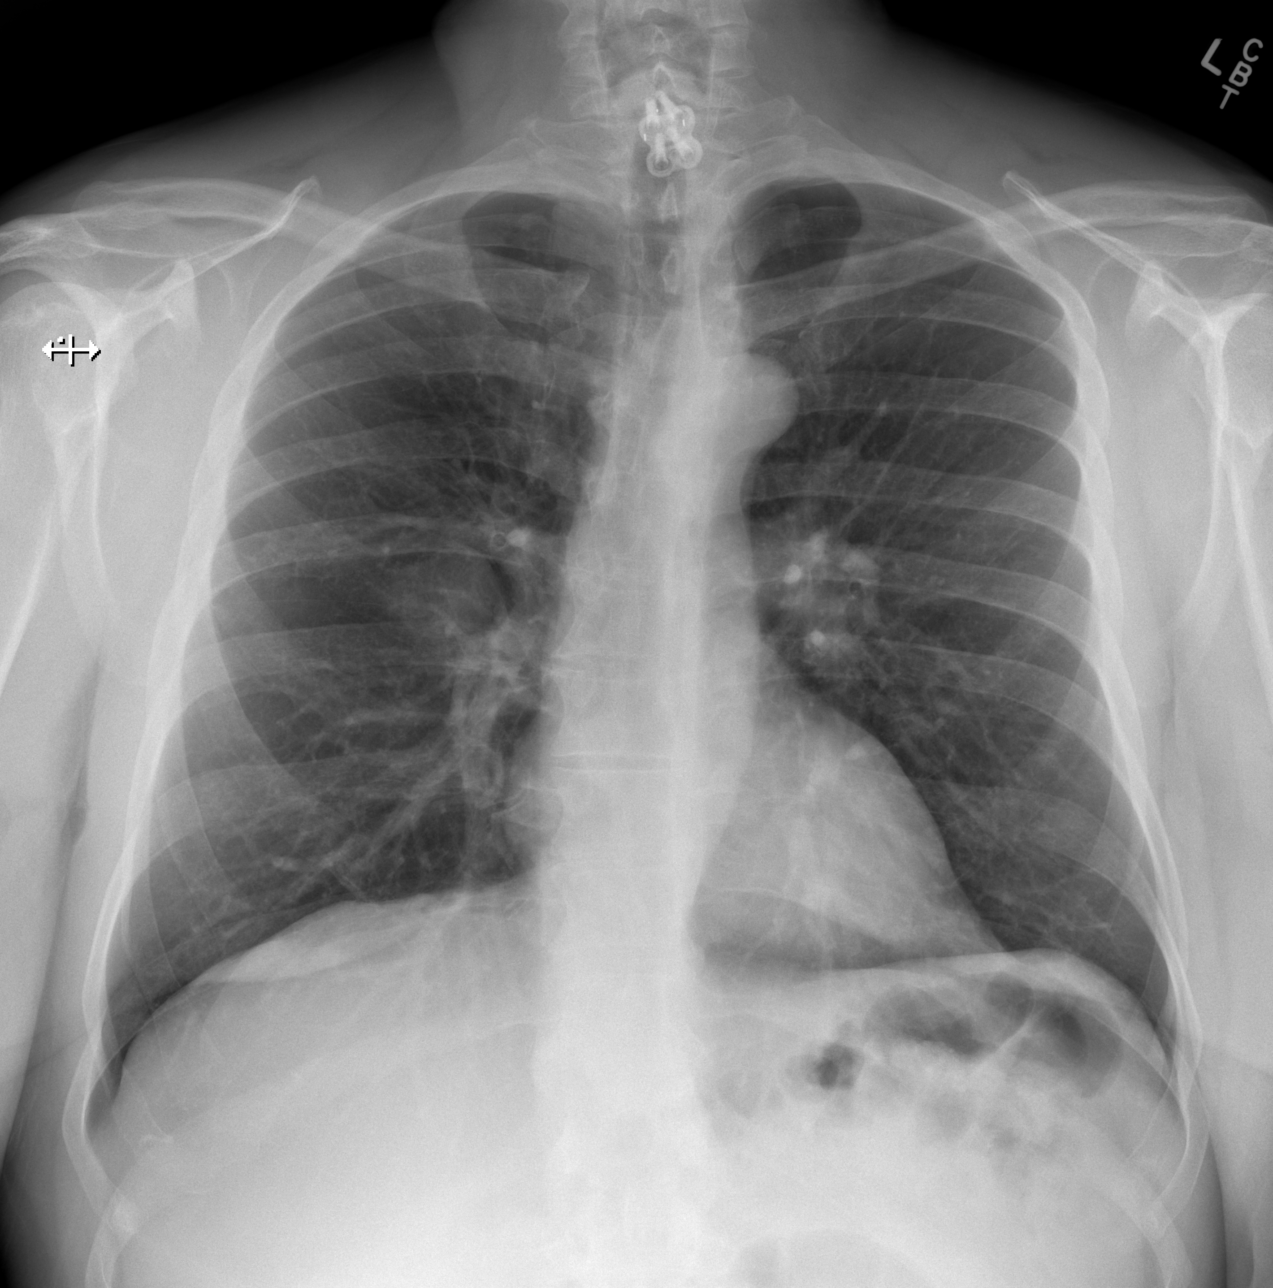

[w chest lat (1 of 2)]
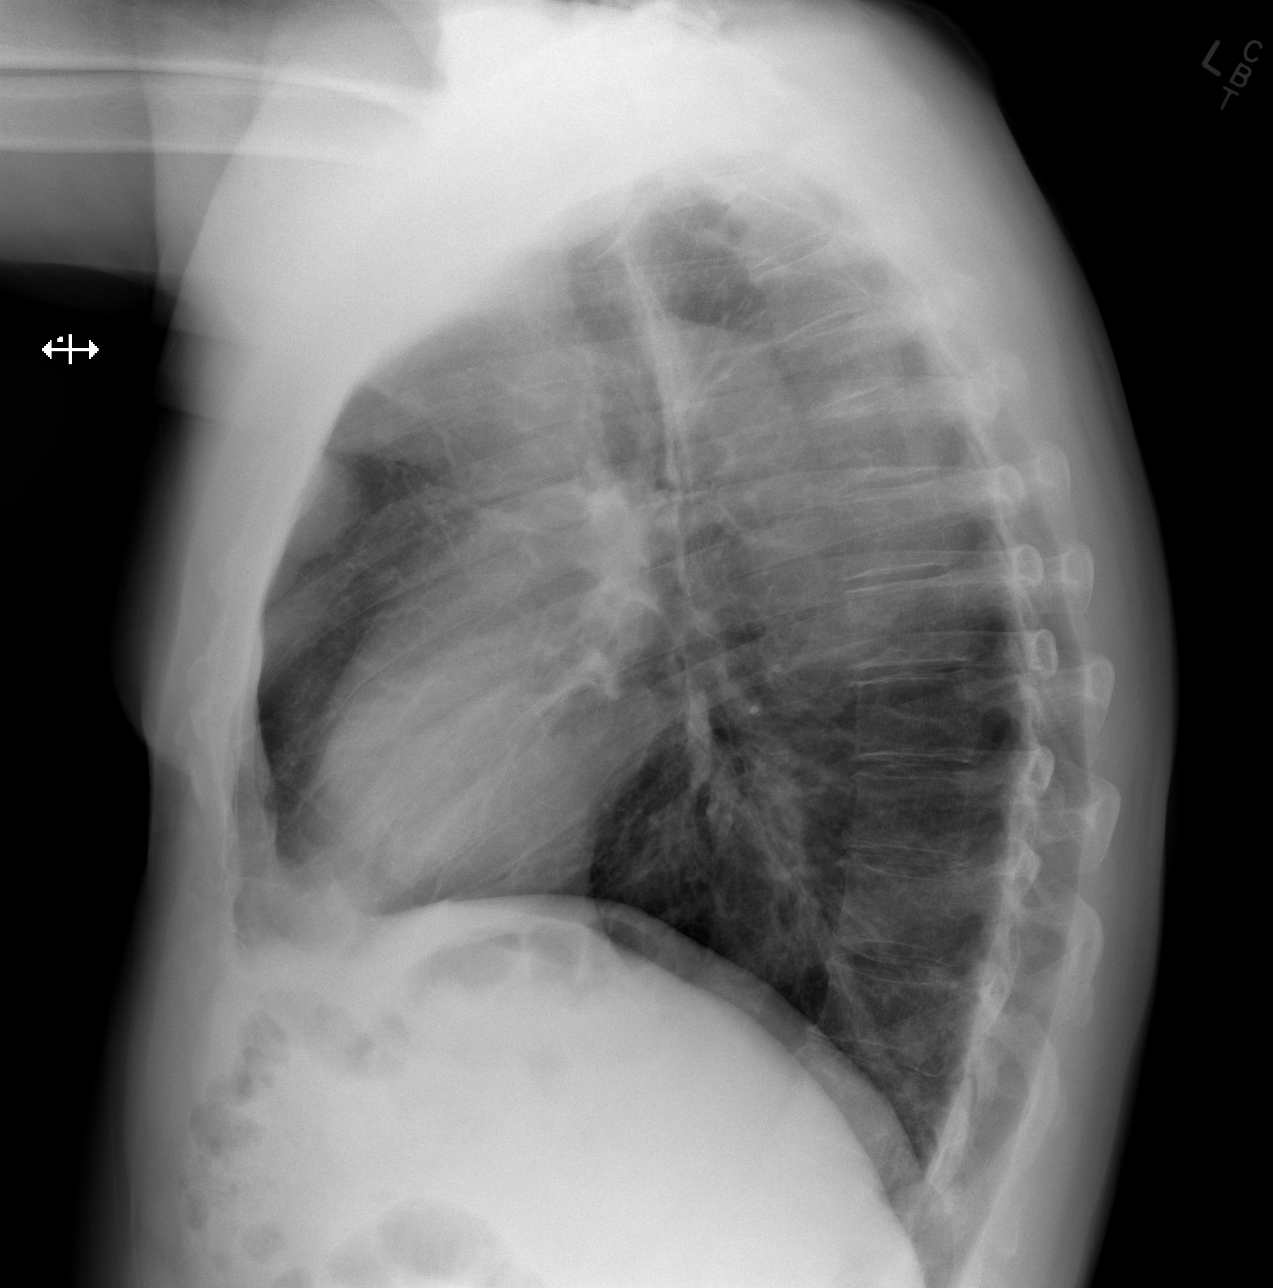

[w chest lat (2 of 2)]
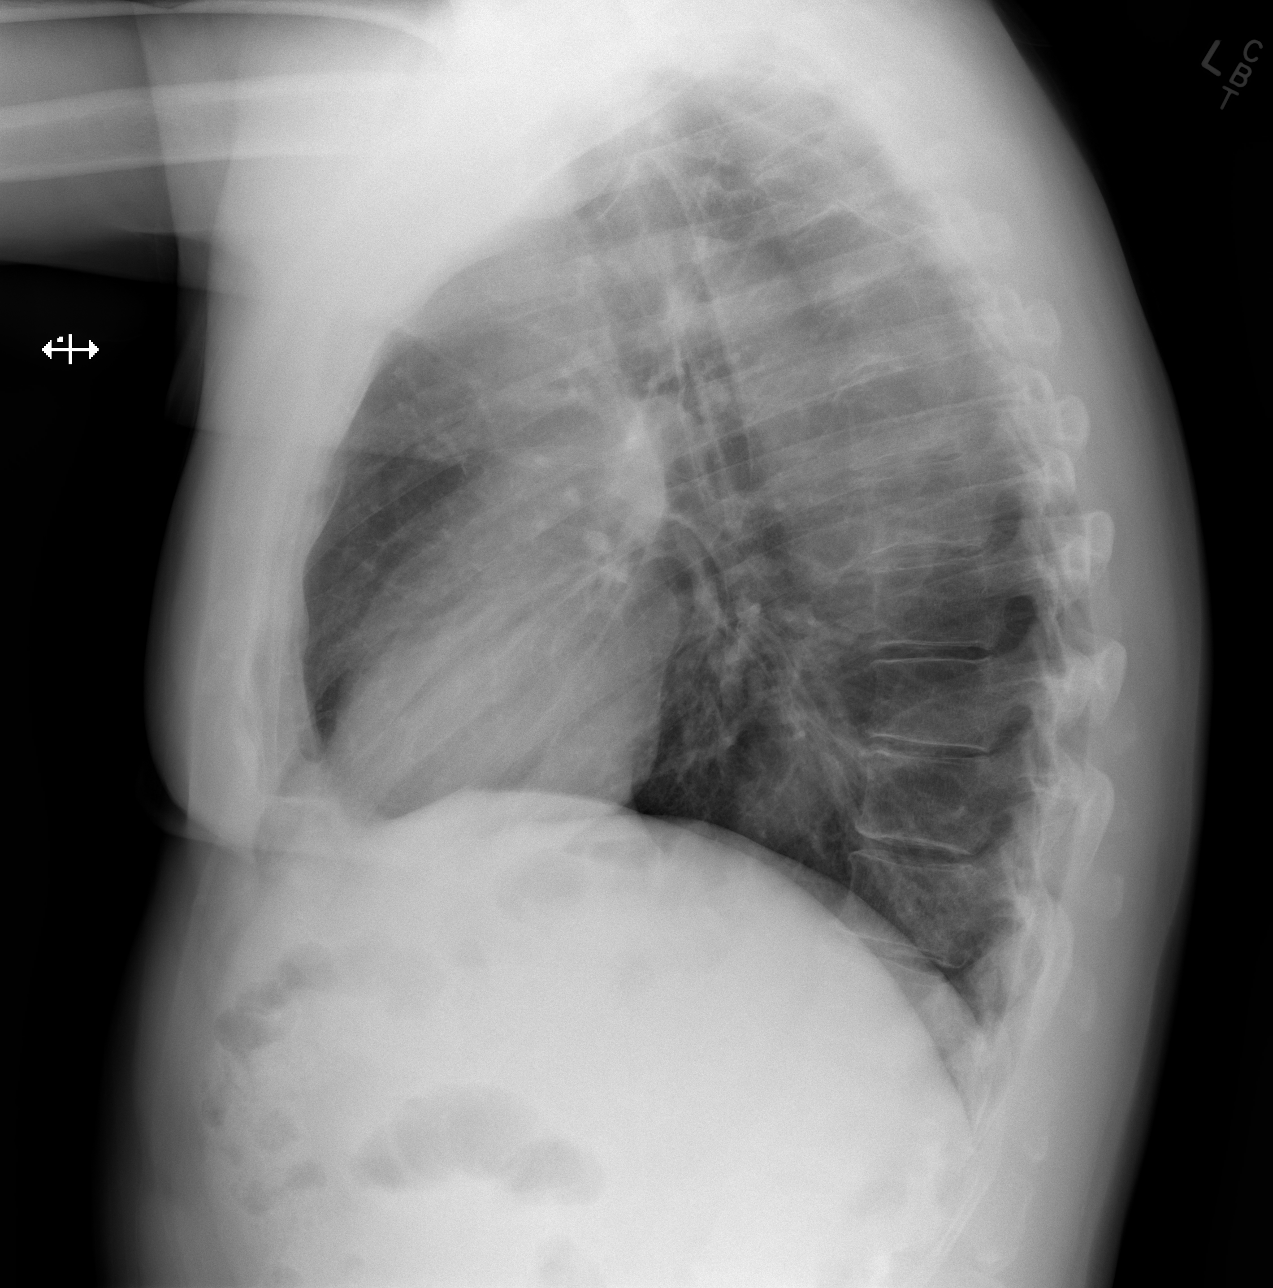

[3 of 3 positions shown; findings below may reference images not displayed]

FINDINGS: There is a 3.6 x 3.8 by 2.1 cm rounded density in the anterior right
chest likely abutting the pleural. Lungs are otherwise clear. There
is no pleural effusion or pneumothorax. Cardiomediastinal silhouette
is within normal limits. No acute fractures are seen.
IMPRESSION: 1. Rounded 3.8 cm masslike density in the anterior right upper lobe,
indeterminate. Recommend further evaluation with chest CT.

## 2022-05-23 ENCOUNTER — Other Ambulatory Visit (HOSPITAL_COMMUNITY): Payer: Self-pay

## 2022-05-24 ENCOUNTER — Other Ambulatory Visit (HOSPITAL_COMMUNITY): Payer: Self-pay

## 2022-05-25 ENCOUNTER — Other Ambulatory Visit: Payer: Self-pay | Admitting: Family Medicine

## 2022-05-25 ENCOUNTER — Ambulatory Visit
Admission: RE | Admit: 2022-05-25 | Discharge: 2022-05-25 | Disposition: A | Discharge status: Home / Self Care | Payer: BC Managed Care – PPO | Source: Ambulatory Visit | Attending: Family Medicine | Admitting: Family Medicine

## 2022-05-25 DIAGNOSIS — D4989 Neoplasm of unspecified behavior of other specified sites: Secondary | ICD-10-CM

## 2022-06-06 ENCOUNTER — Encounter: Payer: Self-pay | Admitting: Psychiatry

## 2022-06-06 ENCOUNTER — Ambulatory Visit (INDEPENDENT_AMBULATORY_CARE_PROVIDER_SITE_OTHER): Payer: BC Managed Care – PPO | Admitting: Psychiatry

## 2022-06-06 VITALS — BP 134/84 | HR 81

## 2022-06-06 DIAGNOSIS — F900 Attention-deficit hyperactivity disorder, predominantly inattentive type: Secondary | ICD-10-CM | POA: Diagnosis not present

## 2022-06-06 DIAGNOSIS — F5105 Insomnia due to other mental disorder: Secondary | ICD-10-CM

## 2022-06-06 DIAGNOSIS — F411 Generalized anxiety disorder: Secondary | ICD-10-CM | POA: Diagnosis not present

## 2022-06-06 DIAGNOSIS — F5081 Binge eating disorder: Secondary | ICD-10-CM

## 2022-06-06 DIAGNOSIS — F3342 Major depressive disorder, recurrent, in full remission: Secondary | ICD-10-CM | POA: Diagnosis not present

## 2022-06-06 DIAGNOSIS — F09 Unspecified mental disorder due to known physiological condition: Secondary | ICD-10-CM
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
## 2022-06-07 ENCOUNTER — Other Ambulatory Visit: Payer: Self-pay | Admitting: Psychiatry

## 2022-06-07 DIAGNOSIS — F5105 Insomnia due to other mental disorder: Secondary | ICD-10-CM

## 2022-06-07 NOTE — Telephone Encounter (Signed)
Filled 5/12 and 5/7 appt on 10/16

## 2022-07-10 ENCOUNTER — Other Ambulatory Visit: Payer: Self-pay | Admitting: Psychiatry

## 2022-07-10 DIAGNOSIS — F5105 Insomnia due to other mental disorder: Secondary | ICD-10-CM

## 2022-08-07 ENCOUNTER — Other Ambulatory Visit: Payer: Self-pay | Admitting: Psychiatry

## 2022-08-07 DIAGNOSIS — F3342 Major depressive disorder, recurrent, in full remission: Secondary | ICD-10-CM

## 2022-08-07 DIAGNOSIS — F411 Generalized anxiety disorder: Secondary | ICD-10-CM

## 2022-08-09 ENCOUNTER — Telehealth: Payer: Self-pay | Admitting: Psychiatry

## 2022-08-09 NOTE — Telephone Encounter (Signed)
Pt called and said that he needs a PA on his dayvigo. He got it filled in July but they won't fill it for august. They want more documentation as to why he is taking it. Pharmacy was to send the deniel over.

## 2022-08-09 NOTE — Telephone Encounter (Signed)
Noted I will check on a PA

## 2022-08-09 NOTE — Telephone Encounter (Signed)
Prior Authorization initiated with [employer] for DAYVIGO 10 MG, PA submitted.

## 2022-08-13 IMAGING — CR DG CHEST 2V
2 series · 2 of 2 positions shown · non-contrast
Comparison: Chest x-ray March 01, 2022.  Chest CT March 02, 2022.

CLINICAL DATA: Neoplasm of chest wall

EXAM:
CHEST - 2 VIEW

[w chest pa]
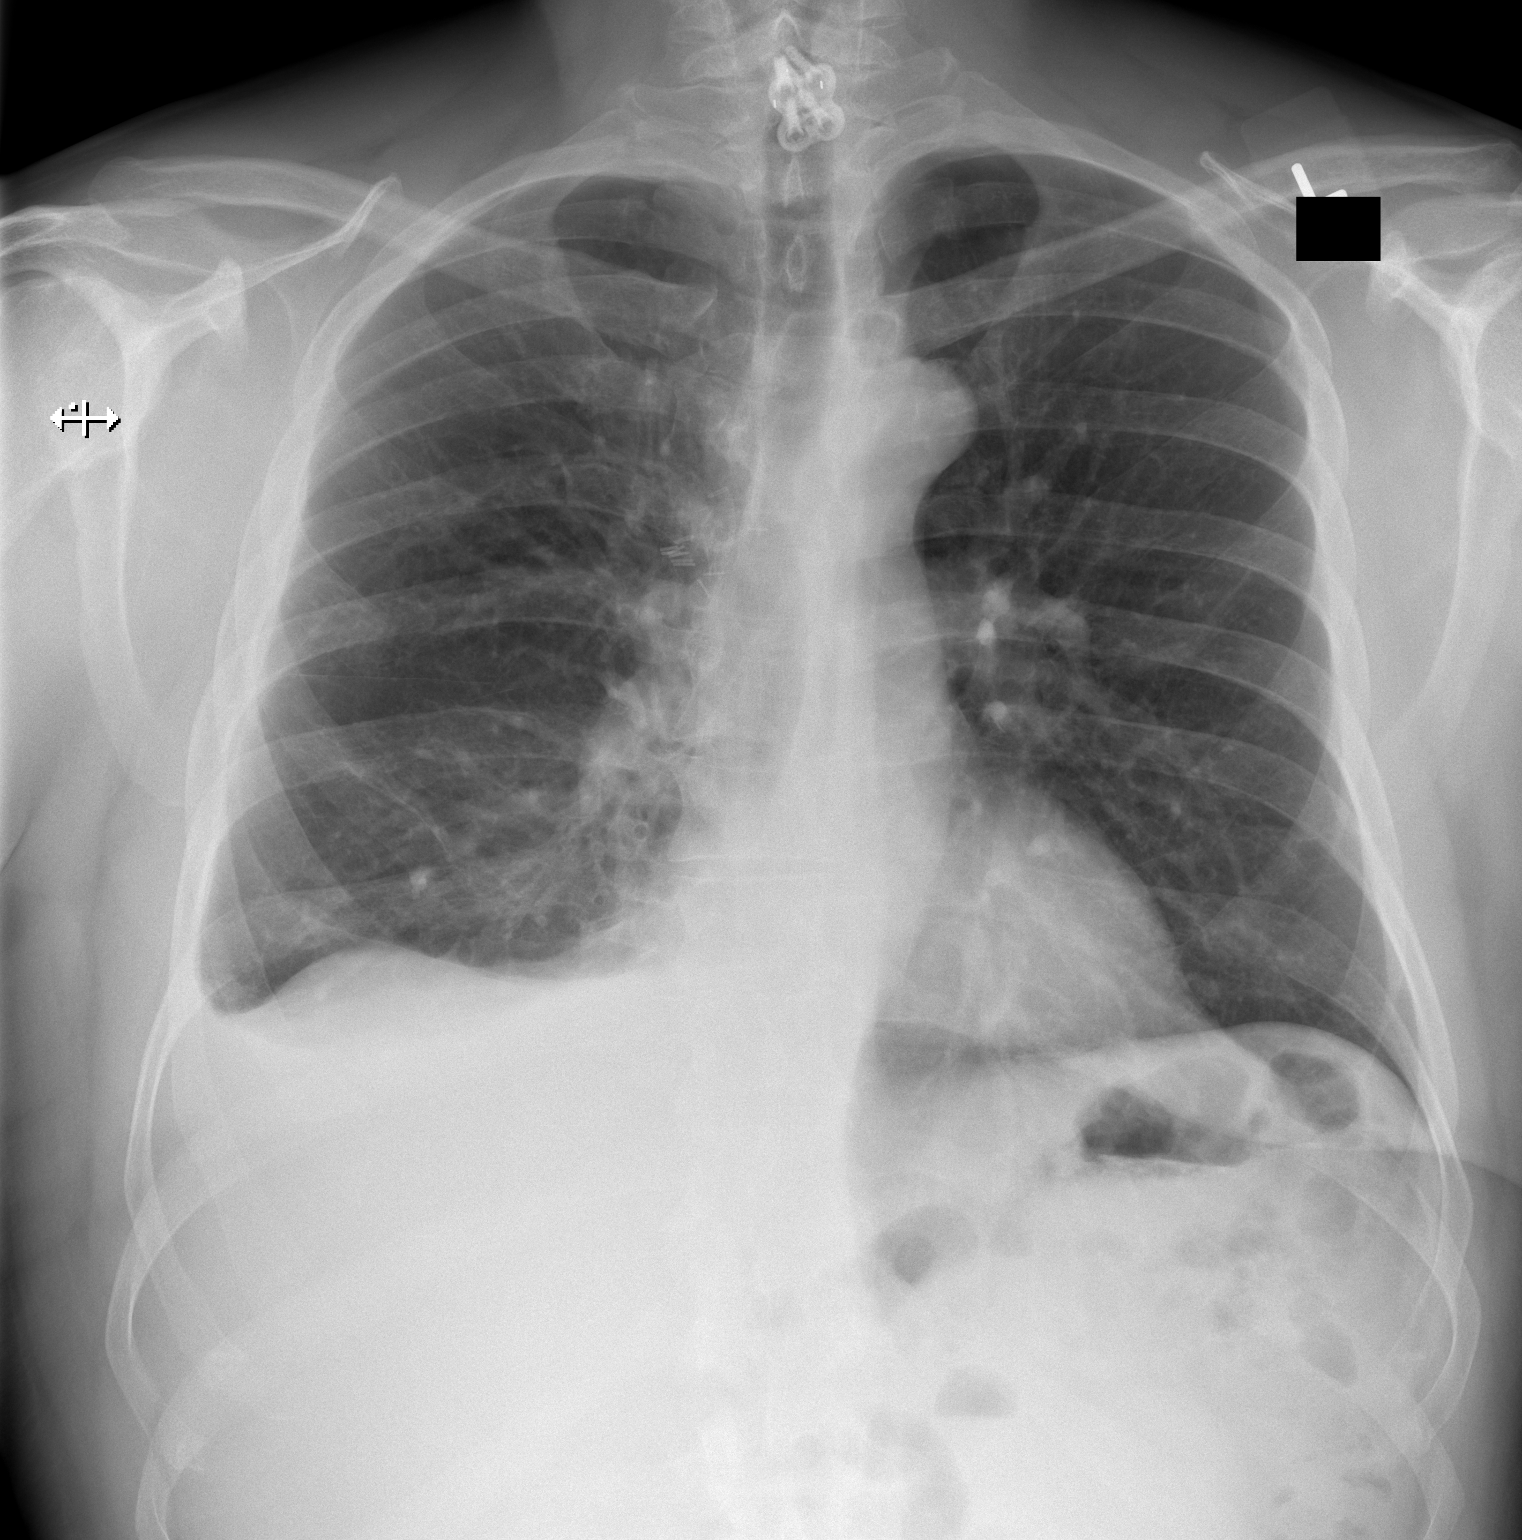

[w chest lat]
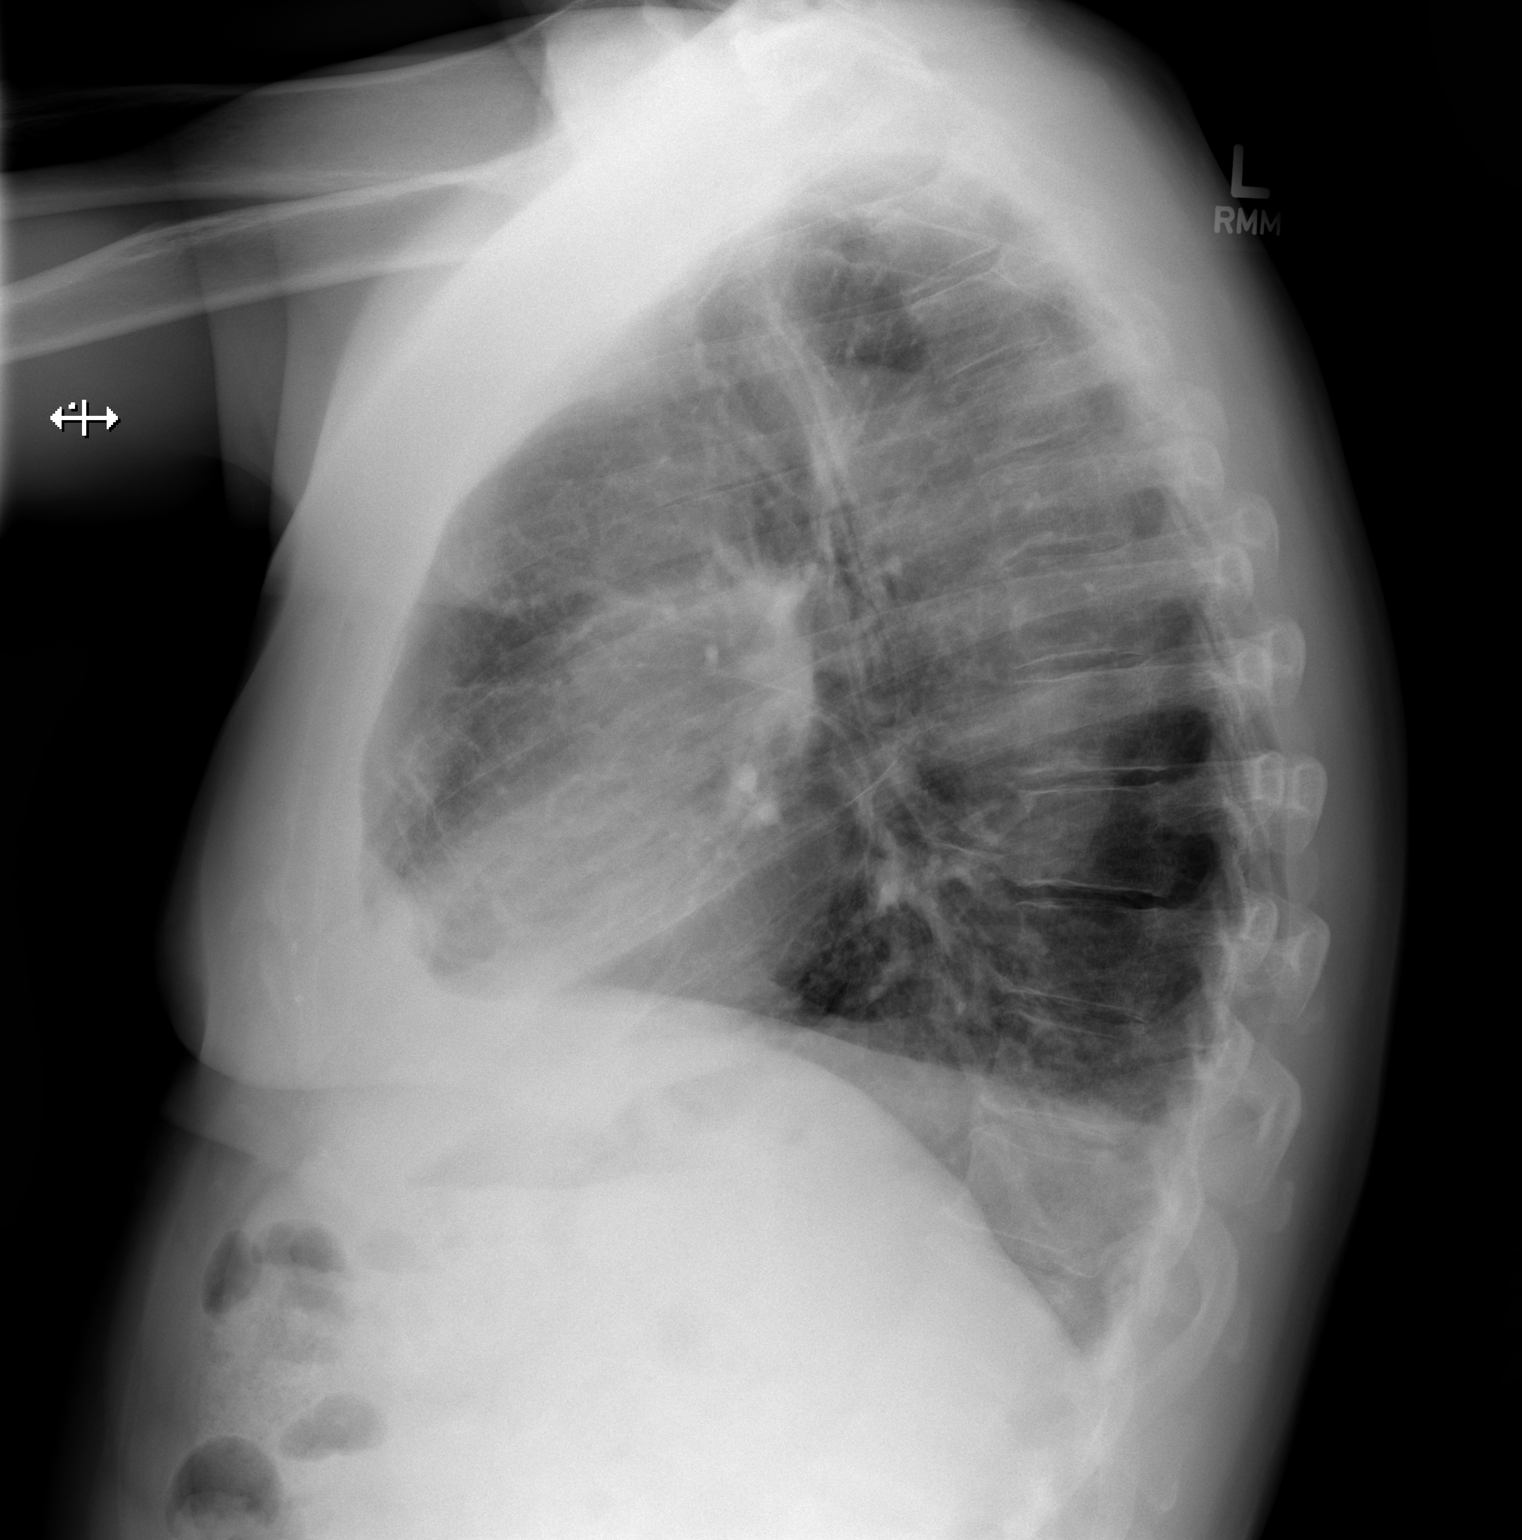

[2 of 2 positions shown; findings below may reference images not displayed]

FINDINGS: There is surgical clips identified at the site of the previously
identified chest wall mass on the right. A new right pleural
effusion with underlying atelectasis is identified. The heart, hila,
mediastinum, lungs, and pleura are otherwise unremarkable.
IMPRESSION: 1. Postop changes in the right chest as above. The previously
identified chest wall mass is no longer visualized.
2. New small right pleural effusion with underlying atelectasis.
3. No other abnormalities.

## 2022-08-14 NOTE — Telephone Encounter (Signed)
Approved effective 08/09/2022-08/08/2023 for [place] 10 MG with [employer]

## 2022-08-31 ENCOUNTER — Other Ambulatory Visit: Payer: Self-pay | Admitting: Psychiatry

## 2022-08-31 DIAGNOSIS — F5105 Insomnia due to other mental disorder: Secondary | ICD-10-CM

## 2022-10-09 ENCOUNTER — Ambulatory Visit: Payer: BC Managed Care – PPO | Admitting: Psychiatry

## 2022-10-11 ENCOUNTER — Other Ambulatory Visit: Payer: Self-pay | Admitting: Psychiatry

## 2022-10-11 DIAGNOSIS — F3342 Major depressive disorder, recurrent, in full remission: Secondary | ICD-10-CM

## 2022-10-11 DIAGNOSIS — F5105 Insomnia due to other mental disorder: Secondary | ICD-10-CM

## 2022-10-11 DIAGNOSIS — F411 Generalized anxiety disorder: Secondary | ICD-10-CM

## 2022-10-17 ENCOUNTER — Encounter: Payer: Self-pay | Admitting: Psychiatry

## 2022-10-17 ENCOUNTER — Ambulatory Visit (INDEPENDENT_AMBULATORY_CARE_PROVIDER_SITE_OTHER): Payer: BC Managed Care – PPO | Admitting: Psychiatry

## 2022-10-17 DIAGNOSIS — F900 Attention-deficit hyperactivity disorder, predominantly inattentive type: Secondary | ICD-10-CM | POA: Diagnosis not present

## 2022-10-17 DIAGNOSIS — F09 Unspecified mental disorder due to known physiological condition: Secondary | ICD-10-CM

## 2022-10-17 DIAGNOSIS — F5105 Insomnia due to other mental disorder: Secondary | ICD-10-CM

## 2022-10-17 DIAGNOSIS — F3342 Major depressive disorder, recurrent, in full remission: Secondary | ICD-10-CM

## 2022-10-17 DIAGNOSIS — F411 Generalized anxiety disorder: Secondary | ICD-10-CM | POA: Diagnosis not present

## 2022-10-17 DIAGNOSIS — F5081 Binge eating disorder: Secondary | ICD-10-CM

## 2022-10-17 DIAGNOSIS — G4733 Obstructive sleep apnea (adult) (pediatric): Secondary | ICD-10-CM

## 2022-10-17 MED ORDER — VILAZODONE HCL 10 MG PO TABS: 10.0000 mg | ORAL_TABLET | Freq: Every day | ORAL | 1 refills | Status: DC

## 2022-10-17 MED ORDER — DAYVIGO 10 MG PO TABS: ORAL_TABLET | ORAL | 5 refills | Status: DC
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
## 2022-11-11 ENCOUNTER — Other Ambulatory Visit: Payer: Self-pay | Admitting: Psychiatry

## 2022-11-11 DIAGNOSIS — F5105 Insomnia due to other mental disorder: Secondary | ICD-10-CM

## 2022-12-12 ENCOUNTER — Other Ambulatory Visit: Payer: Self-pay | Admitting: Psychiatry

## 2022-12-12 DIAGNOSIS — F5105 Insomnia due to other mental disorder: Secondary | ICD-10-CM

## 2022-12-12 NOTE — Telephone Encounter (Signed)
Filled 11/20 appt 4/24

## 2023-02-13 ENCOUNTER — Other Ambulatory Visit: Payer: Self-pay | Admitting: Psychiatry

## 2023-02-13 DIAGNOSIS — F5105 Insomnia due to other mental disorder: Secondary | ICD-10-CM

## 2023-03-23 ENCOUNTER — Other Ambulatory Visit (HOSPITAL_COMMUNITY): Payer: Self-pay

## 2023-03-23 MED ORDER — MOUNJARO 10 MG/0.5ML ~~LOC~~ SOAJ
10.0000 mg | SUBCUTANEOUS | 0 refills | Status: DC
  Filled 2023-03-23: qty 2, 28d supply, fill #0
  Filled 2023-04-12 – 2023-04-17 (×2): qty 2, 28d supply, fill #1
  Filled 2023-07-26 – 2023-08-03 (×2): qty 2, 28d supply, fill #2

## 2023-03-26 ENCOUNTER — Other Ambulatory Visit (HOSPITAL_COMMUNITY): Payer: Self-pay

## 2023-04-07 ENCOUNTER — Other Ambulatory Visit: Payer: Self-pay | Admitting: Psychiatry

## 2023-04-07 DIAGNOSIS — F3342 Major depressive disorder, recurrent, in full remission: Secondary | ICD-10-CM

## 2023-04-11 ENCOUNTER — Other Ambulatory Visit: Payer: Self-pay | Admitting: Psychiatry

## 2023-04-11 DIAGNOSIS — F3342 Major depressive disorder, recurrent, in full remission: Secondary | ICD-10-CM

## 2023-04-11 DIAGNOSIS — F411 Generalized anxiety disorder: Secondary | ICD-10-CM

## 2023-04-12 ENCOUNTER — Other Ambulatory Visit (HOSPITAL_COMMUNITY): Payer: Self-pay

## 2023-04-18 ENCOUNTER — Ambulatory Visit: Payer: BC Managed Care – PPO | Admitting: Psychiatry

## 2023-04-27 ENCOUNTER — Encounter: Payer: Self-pay | Admitting: Psychiatry

## 2023-04-27 ENCOUNTER — Ambulatory Visit (INDEPENDENT_AMBULATORY_CARE_PROVIDER_SITE_OTHER): Payer: No Typology Code available for payment source | Admitting: Psychiatry

## 2023-04-27 DIAGNOSIS — F411 Generalized anxiety disorder: Secondary | ICD-10-CM

## 2023-04-27 DIAGNOSIS — F900 Attention-deficit hyperactivity disorder, predominantly inattentive type: Secondary | ICD-10-CM | POA: Diagnosis not present

## 2023-04-27 DIAGNOSIS — F09 Unspecified mental disorder due to known physiological condition: Secondary | ICD-10-CM

## 2023-04-27 DIAGNOSIS — F5081 Binge eating disorder: Secondary | ICD-10-CM

## 2023-04-27 DIAGNOSIS — F5105 Insomnia due to other mental disorder: Secondary | ICD-10-CM

## 2023-04-27 DIAGNOSIS — F3342 Major depressive disorder, recurrent, in full remission: Secondary | ICD-10-CM

## 2023-04-27 DIAGNOSIS — G4733 Obstructive sleep apnea (adult) (pediatric): Secondary | ICD-10-CM

## 2023-04-27 MED ORDER — DAYVIGO 10 MG PO TABS: ORAL_TABLET | ORAL | 3 refills | Status: DC

## 2023-04-27 MED ORDER — VILAZODONE HCL 10 MG PO TABS: 10.0000 mg | ORAL_TABLET | Freq: Every day | ORAL | 1 refills | Status: DC
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
## 2023-05-01 ENCOUNTER — Other Ambulatory Visit (HOSPITAL_COMMUNITY): Payer: Self-pay

## 2023-05-01 MED ORDER — MOUNJARO 10 MG/0.5ML ~~LOC~~ SOAJ
10.0000 mg | SUBCUTANEOUS | 0 refills | Status: DC
  Filled 2023-05-01: qty 6, 84d supply, fill #0
  Filled 2023-05-16 – 2023-06-11 (×2): qty 2, 28d supply, fill #0
  Filled 2023-07-04: qty 2, 28d supply, fill #1
  Filled 2023-07-30: qty 2, 28d supply, fill #2

## 2023-05-16 ENCOUNTER — Other Ambulatory Visit (HOSPITAL_COMMUNITY): Payer: Self-pay

## 2023-05-18 ENCOUNTER — Other Ambulatory Visit (HOSPITAL_COMMUNITY): Payer: Self-pay

## 2023-05-24 ENCOUNTER — Other Ambulatory Visit (HOSPITAL_COMMUNITY): Payer: Self-pay

## 2023-05-29 ENCOUNTER — Other Ambulatory Visit: Payer: Self-pay

## 2023-06-05 ENCOUNTER — Other Ambulatory Visit (HOSPITAL_COMMUNITY): Payer: Self-pay

## 2023-06-11 ENCOUNTER — Other Ambulatory Visit: Payer: Self-pay

## 2023-06-11 ENCOUNTER — Other Ambulatory Visit (HOSPITAL_COMMUNITY): Payer: Self-pay

## 2023-06-26 ENCOUNTER — Other Ambulatory Visit (HOSPITAL_COMMUNITY): Payer: Self-pay

## 2023-07-24 ENCOUNTER — Ambulatory Visit: Payer: Self-pay | Admitting: Sports Medicine

## 2023-07-24 VITALS — BP 122/80 | Ht 73.0 in | Wt 216.0 lb

## 2023-07-24 DIAGNOSIS — D167 Benign neoplasm of ribs, sternum and clavicle: Secondary | ICD-10-CM | POA: Diagnosis not present

## 2023-07-24 DIAGNOSIS — M67951 Unspecified disorder of synovium and tendon, right thigh: Secondary | ICD-10-CM | POA: Insufficient documentation

## 2023-07-24 NOTE — Progress Notes (Signed)
CC: Right low back and some posterior right shoulder pain  Since I last saw the patient he has had a chondrosarcoma that required removal of right ribs 3-5 anteriorly and a portion of the sternum.  They placed a matrix type graft and repositioned his pectoralis major. Since that time he has had some posterior shoulder pain even though he has gotten back into weight exercises and strengthening work.  He has a history of a C3-4 fusion but currently does not have any radicular symptoms into his right shoulder or arm.  Additionally he has some right lower back pain that localizes over the muscles down to mid upper and lateral buttocks.  Physical exam Muscular white male in no acute distress BP 122/80   Ht 6\' 1"  (1.854 m)   Wt 216 lb (98 kg)   BMI 28.50 kg/m   Shoulder: RT Inspection reveals no abnormalities, atrophy or asymmetry. Palpation is normal with no tenderness over AC joint or bicipital groove. ROM is full in all planes. Rotator cuff strength normal throughout. No signs of impingement with negative Neer and Hawkin's tests, empty can. Speeds and Yergason's tests normal. No labral pathology noted with negative Obrien's, negative clunk and good stability. Normal scapular function observed. No painful arc and no drop arm sign. No apprehension sign  The only part of the shoulder exam that gives him any discomfort is external rotation when the shoulder is elevated and abducted to 90 degrees  Note on his anterior chest he has a significant gap in the tissue from his previous surgery and it appears that the pectoralis tendon is reattached lower on the arm.  Low back exam is not remarkable but there is some tenderness on the upper portion of the gluteus medius at its insertion to the iliac crest and over toward the lateral hip

## 2023-07-24 NOTE — Assessment & Plan Note (Signed)
While he appears to have healed well from his surgery I think this is given him some strength imbalance in his right shoulder  I feel we should give him rotator cuff exercises and perform the external rotation both at neutral and an elevated position.  I believe if we strengthen his rotator cuff he may have less symptoms even though his anatomy has been offered by the movement of the pectoralis major

## 2023-07-24 NOTE — Assessment & Plan Note (Signed)
I gave him a standard set of 4 strength exercises for hip abduction and stabilizing his gluteus medius He is to perform 3 sets of 15 of these each day I would like to recheck this in about 6 weeks but expect he should have significant improvement He can use occasional Aleve or ibuprofen if he needs it for some of the lateral hip pain

## 2023-07-27 ENCOUNTER — Other Ambulatory Visit (HOSPITAL_COMMUNITY): Payer: Self-pay

## 2023-07-27 ENCOUNTER — Other Ambulatory Visit: Payer: Self-pay

## 2023-08-01 ENCOUNTER — Other Ambulatory Visit: Payer: Self-pay | Admitting: Psychiatry

## 2023-08-01 DIAGNOSIS — F3342 Major depressive disorder, recurrent, in full remission: Secondary | ICD-10-CM

## 2023-08-02 ENCOUNTER — Other Ambulatory Visit (HOSPITAL_COMMUNITY): Payer: Self-pay

## 2023-08-06 ENCOUNTER — Other Ambulatory Visit (HOSPITAL_COMMUNITY): Payer: Self-pay

## 2023-08-06 MED ORDER — ZEPBOUND 10 MG/0.5ML ~~LOC~~ SOAJ
SUBCUTANEOUS | 0 refills | Status: DC
  Filled 2023-08-06: qty 2, 28d supply, fill #0

## 2023-08-22 ENCOUNTER — Other Ambulatory Visit (HOSPITAL_COMMUNITY): Payer: Self-pay

## 2023-08-28 ENCOUNTER — Encounter: Payer: Self-pay | Admitting: Psychiatry

## 2023-08-28 ENCOUNTER — Ambulatory Visit: Payer: No Typology Code available for payment source | Admitting: Psychiatry

## 2023-08-28 DIAGNOSIS — F5081 Binge eating disorder: Secondary | ICD-10-CM

## 2023-08-28 DIAGNOSIS — F09 Unspecified mental disorder due to known physiological condition: Secondary | ICD-10-CM | POA: Diagnosis not present

## 2023-08-28 DIAGNOSIS — F3342 Major depressive disorder, recurrent, in full remission: Secondary | ICD-10-CM

## 2023-08-28 DIAGNOSIS — G4733 Obstructive sleep apnea (adult) (pediatric): Secondary | ICD-10-CM

## 2023-08-28 DIAGNOSIS — F5105 Insomnia due to other mental disorder: Secondary | ICD-10-CM

## 2023-08-28 DIAGNOSIS — F411 Generalized anxiety disorder: Secondary | ICD-10-CM

## 2023-08-28 DIAGNOSIS — F900 Attention-deficit hyperactivity disorder, predominantly inattentive type: Secondary | ICD-10-CM | POA: Diagnosis not present

## 2023-08-28 MED ORDER — LORAZEPAM 0.5 MG PO TABS
0.5000 mg | ORAL_TABLET | Freq: Three times a day (TID) | ORAL | 3 refills | Status: DC | PRN
Start: 2023-08-28 — End: 2023-10-30

## 2023-08-28 MED ORDER — DAYVIGO 10 MG PO TABS: ORAL_TABLET | ORAL | 3 refills | Status: DC
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
## 2023-08-29 ENCOUNTER — Other Ambulatory Visit (HOSPITAL_COMMUNITY): Payer: Self-pay

## 2023-08-30 ENCOUNTER — Other Ambulatory Visit (HOSPITAL_COMMUNITY): Payer: Self-pay

## 2023-08-31 ENCOUNTER — Other Ambulatory Visit (HOSPITAL_COMMUNITY): Payer: Self-pay

## 2023-08-31 MED ORDER — ZEPBOUND 10 MG/0.5ML ~~LOC~~ SOAJ
10.0000 mg | SUBCUTANEOUS | 0 refills | Status: DC
  Filled 2023-08-31: qty 2, 28d supply, fill #0

## 2023-09-04 ENCOUNTER — Encounter: Payer: Self-pay | Admitting: Sports Medicine

## 2023-09-04 ENCOUNTER — Ambulatory Visit (INDEPENDENT_AMBULATORY_CARE_PROVIDER_SITE_OTHER): Payer: No Typology Code available for payment source | Admitting: Sports Medicine

## 2023-09-04 VITALS — BP 118/86 | Ht 73.0 in | Wt 216.0 lb

## 2023-09-04 DIAGNOSIS — M898X1 Other specified disorders of bone, shoulder: Secondary | ICD-10-CM

## 2023-09-04 NOTE — Progress Notes (Unsigned)
Joshua Pearson - 57 y.o. male MRN [id number]  Date of birth: [birth year]  PCP: Noberto Retort, MD  Subjective:  No chief complaint on file.  Pain in R shoulder blade  HPI: Past Medical, Surgical, Social, and Family History Reviewed & Updated per EMR.   Patient is a 57 y.o. male w/ PMHx significant for chondrosarcoma resection including ribs 3-5 anteriorly and partial sternectomy, here for follow up on R scapular pain and lumbar/gluteal pain, last seen on 07/24/2023. The patient has tried conservative management with ROM exercises and strengthening  which has resolved his lumbar/gluteal pain and increased strength but R scapular pain still present although improved. He has not had any new neck pain or neurologic sxs.   History reviewed. No pertinent past medical history.  Current Outpatient Medications on File Prior to Visit  Medication Sig Dispense Refill   cabergoline (DOSTINEX) 0.5 MG tablet Take 0.5 mg by mouth 2 (two) times a week.     Cholecalciferol 125 MCG (5000 UT) TABS Take by mouth.     DENTA 5000 PLUS 1.1 % CREA dental cream      L-Methylfolate 15 MG TABS TAKE 1 TABLET BY MOUTH EVERY DAY 90 tablet 3   lansoprazole (PREVACID) 15 MG capsule Take by mouth. 2 daily     Lemborexant (DAYVIGO) 10 MG TABS TAKE 1 TABLET BY MOUTH EVERYDAY AT BEDTIME 30 tablet 3   levETIRAcetam (KEPPRA) 750 MG tablet Take 750 mg by mouth 2 (two) times daily.     levothyroxine (SYNTHROID, LEVOTHROID) 75 MCG tablet      lisdexamfetamine (VYVANSE) 20 MG capsule Take by mouth daily.     loratadine (CLARITIN) 10 MG tablet Take by mouth.     LORazepam (ATIVAN) 0.5 MG tablet Take 1 tablet (0.5 mg total) by mouth every 8 (eight) hours as needed for anxiety. 30 tablet 3   Magnesium 500 MG CAPS Take 500 mg by mouth.     methocarbamol (ROBAXIN) 500 MG tablet Take 1 tablet (500 mg total) by mouth 4 (four) times daily as needed for muscle spasms. 30 tablet 2   mirabegron ER (MYRBETRIQ) 50 MG TB24 tablet Take 50 mg by  mouth daily.     naproxen sodium (ALEVE) 220 MG tablet Take by mouth.     Phosphatidylserine 100 MG CAPS Take 1 capsule by mouth daily.     tadalafil (CIALIS) 10 MG tablet Take 10 mg by mouth at bedtime.     Testosterone Undecanoate (JATENZO) 237 MG CAPS Take 1 capsule by mouth in the morning and at bedtime.     tirzepatide (ZEPBOUND) 10 MG/0.5ML Pen Inject 10 mg into the skin once a week. 2 mL 0   Vilazodone HCl (VIIBRYD) 10 MG TABS Take 1 tablet (10 mg total) by mouth daily. 90 tablet 1   vitamin B-12 (CYANOCOBALAMIN) 500 MCG tablet Take 5,000 mcg by mouth daily.     No current facility-administered medications on file prior to visit.    History reviewed. No pertinent surgical history.  Allergies  Allergen Reactions   Doxycycline Anaphylaxis   Aspirin Other (See Comments)    dizziness   Gabapentin    Lyrica [Pregabalin]    Prednisone    Topamax [Topiramate]         Objective:  Physical Exam: VS: BP:118/86  HR: bpm  TEMP: ( )  RESP:   HT:6\' 1"  (185.4 cm)   WT:216 lb (98 kg)  BMI:28.5  Gen: NAD, speaks clearly, comfortable in exam room Respiratory:  Normal respiratory effort on room air. No signs of distress Skin: No rashes, abrasions, or ecchymosis MSK: Normal gait and observation of the back reveals normal posture w/ normal cervical lordosis and minor anterior rotation of the R shoulder at rest Deficit of pec major mid muscle belly over the R but strong musculature surrounding Full range of motion of abduction and neg painful arc with spastic contraction of the rhomboid major between 175 to 130 degrees of eccentric adduction. Spasm and tenderness palpated on the R Rhomboid minor at rest.  Empty can, O'briens, hawkins, and cross over negative Mild pain w/ external rotation at 75 degrees and restriction No sensory deficits distally.     Assessment & Plan:   Periscapular pain - Video demonstration of right scapular "shimmering" (para scapular muscle insufficiency)  discussed with patient with overhead movement of shoulders. - We discussed at length multiple exercises for scapular stability strengthening, particularly of the Rhomboid minor and serratus anterior.  They were demonstrated and then the patient perform these.  Programme researcher, broadcasting/film/video given. - With consistent range of motion and strengthening exercises I believe he will regain some of his former function.  - We will follow up in 2 months to reevaluate.     Rica Mote MD [facility] Fellow  I observed and examined the patient with the [facility] resident and agree with assessment and plan.  Note reviewed and modified by me. Sterling Big, MD

## 2023-09-04 NOTE — Assessment & Plan Note (Addendum)
-   Video demonstration of right scapular "shimmering" (para scapular muscle insufficiency) discussed with patient with overhead movement of shoulders. - We discussed at length multiple exercises for scapular stability strengthening, particularly of the Rhomboid minor and serratus anterior.  They were demonstrated and then the patient perform these.  Programme researcher, broadcasting/film/video given. - With consistent range of motion and strengthening exercises I believe he will regain some of his former function.  - We will follow up in 2 months to reevaluate.

## 2023-09-04 NOTE — Patient Instructions (Addendum)
Strengthening exercises for the scapular stabilizers Start with light stretching of the chest to allow the back muscles to activate Chair arm torso lifts - (straight arms, use legs to support your weight so the serratus anterior muscles activate without pain) Single arm, straight arm, dumbell push using the scapular muscles (light weight using the serratus anterior muscles. Small movement) [employer], pulling cable weight across the body with a bent elbow.  Modified lawn mower: very light weight cable, keeping the arm straight, pull across the body diagonally and into a full "Y" position Cable rows with arms in lower plain and upper plain Light weighted tennis simulation movements.

## 2023-09-27 ENCOUNTER — Other Ambulatory Visit (HOSPITAL_COMMUNITY): Payer: Self-pay

## 2023-09-27 MED ORDER — ZEPBOUND 12.5 MG/0.5ML ~~LOC~~ SOAJ
12.5000 mg | SUBCUTANEOUS | 0 refills | Status: DC
  Filled 2023-09-27: qty 2, 28d supply, fill #0

## 2023-10-10 ENCOUNTER — Other Ambulatory Visit: Payer: Self-pay | Admitting: Psychiatry

## 2023-10-10 DIAGNOSIS — F411 Generalized anxiety disorder: Secondary | ICD-10-CM

## 2023-10-10 DIAGNOSIS — F3342 Major depressive disorder, recurrent, in full remission: Secondary | ICD-10-CM

## 2023-10-10 NOTE — Telephone Encounter (Signed)
Per last office note 09/3: "Continue viibryd 5 mg.Marland Kitchen"There are no telephone encounters stating a decrease, rx is written for 10mg . Does the sig or dose need to be changed?

## 2023-10-18 ENCOUNTER — Ambulatory Visit: Payer: No Typology Code available for payment source | Admitting: Sports Medicine

## 2023-10-25 ENCOUNTER — Encounter (HOSPITAL_COMMUNITY): Payer: Self-pay

## 2023-10-25 ENCOUNTER — Other Ambulatory Visit (HOSPITAL_COMMUNITY): Payer: Self-pay

## 2023-10-26 ENCOUNTER — Other Ambulatory Visit (HOSPITAL_COMMUNITY): Payer: Self-pay

## 2023-10-26 MED ORDER — ZEPBOUND 12.5 MG/0.5ML ~~LOC~~ SOAJ
12.5000 mg | SUBCUTANEOUS | 0 refills | Status: DC
  Filled 2023-10-26: qty 2, 28d supply, fill #0

## 2023-10-30 ENCOUNTER — Other Ambulatory Visit: Payer: Self-pay | Admitting: Psychiatry

## 2023-10-30 DIAGNOSIS — F5105 Insomnia due to other mental disorder: Secondary | ICD-10-CM

## 2023-10-30 NOTE — Telephone Encounter (Signed)
LF 08/15; LV 09/3 NV 02/26/24; I changed rf qty to 4 to get to NV. In the office note it says PRN, not used often. Should the rf amnt be less?

## 2023-11-21 ENCOUNTER — Ambulatory Visit: Payer: No Typology Code available for payment source | Admitting: Sports Medicine

## 2023-11-21 ENCOUNTER — Other Ambulatory Visit (HOSPITAL_COMMUNITY): Payer: Self-pay

## 2023-11-26 ENCOUNTER — Other Ambulatory Visit (HOSPITAL_COMMUNITY): Payer: Self-pay

## 2023-11-26 ENCOUNTER — Encounter (HOSPITAL_COMMUNITY): Payer: Self-pay

## 2023-11-26 MED ORDER — ZEPBOUND 12.5 MG/0.5ML ~~LOC~~ SOAJ
12.5000 mg | SUBCUTANEOUS | 0 refills | Status: DC
  Filled 2023-11-26: qty 6, 84d supply, fill #0
  Filled 2023-11-27: qty 2, 28d supply, fill #0

## 2023-11-27 ENCOUNTER — Other Ambulatory Visit (HOSPITAL_COMMUNITY): Payer: Self-pay

## 2023-12-13 ENCOUNTER — Other Ambulatory Visit: Payer: Self-pay

## 2023-12-13 ENCOUNTER — Ambulatory Visit: Payer: No Typology Code available for payment source | Admitting: Sports Medicine

## 2023-12-13 VITALS — BP 118/73 | Ht 73.0 in | Wt 212.0 lb

## 2023-12-13 DIAGNOSIS — M898X1 Other specified disorders of bone, shoulder: Secondary | ICD-10-CM

## 2023-12-13 DIAGNOSIS — M25511 Pain in right shoulder: Secondary | ICD-10-CM

## 2023-12-13 NOTE — Progress Notes (Signed)
   PCP: Noberto Retort, MD  SUBJECTIVE:   HPI:  Patient is a 57 y.o. male here for follow-up on his right periscapular pain and low back pain. He has a new complaint of right shoulder pain.   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 He states the spasms/pain along the inferomedial scapula and his low back pain are much improved. He does feel pain in his shoulder with certain overhead lifting activities. No known injury. Has history of previous C3-4 fusion though neck has been doing ok, especially with modification of his lifting to avoid less neck flexion. No other concerns.  ROS:     See HPI  OBJECTIVE:  BP 118/73   Ht 6\' 1"  (1.854 m)   Wt 212 lb (96.2 kg)   BMI 27.97 kg/m   PHYSICAL EXAM:  GEN: Alert and Oriented, NAD, comfortable in exam room RESP: Unlabored respirations, Paradoxical chest wall mvmt on anterior right chest with inspiration.  PSY: normal mood, congruent affect   MSK EXAM: Right Shoulder No swelling, ecchymoses. Some mild residual shimmering of his right periscapula musculature along inferomedial border, though greatly improved from previous.  TTP along posterolateral shoulder, mild TTP along inferomedial scapula. FROM of right shoulder, Fairly symmetric motion of scapula with shoulder abduction, though slightly more lateral movement inferiorly. Negative Hawkins, Neers. Negative Yergasons. Strength 5/5 with empty can and resisted internal/external rotation, though pain is elicited with ER. NV intact distally.  Complete MSK [place] of Right Shoulder Patient was seated on exam table and shoulder US examination was performed using high frequency linear probe. Images saved to PACS. -Biceps tendon visualized within the bicipital groove in both SAX and LAX with no significant hypoechoic fluid and intact tendon fibers without signs of  irregularity.  -Subscapularis tendon visualized in both SAX and LAX with intact tendon fibers without signs of irregularity. No significant hypoechoic changes.  -Supraspinatus tendon visualized in both SAX and LAX with intact tendon fibers without signs of irregularity. No significant hypoechoic changes. -Infraspinatus and teres minor tendons were visualized in both SAX and LAX with partial thickness disruption of the infraspinatus distal tendon fibers at articular surface of the greater tuberosity with hypoechoic fluid collection.  -Posterior glenohumeral joint was visualized with proper alignment.  [facility] Joint was visualized without bursal distension or significant bony spurs/arthritic changes.    IMPRESSION: Acute partial thickness articular surface tear of the right infraspinatus near intersection of the supraspinatus.   U/S performed and interpreted by Glean Salen, MD with supervision of Roanna Epley, MD. Assessment & Plan Right shoulder pain, unspecified chronicity Exam and ultrasound findings consistent with articular surface tear of the infraspinatus tendon. We discussed trying cortisone injection, however given that he is painless at rest he deferred and elected to modify his activity and incorporate rotator cuff specific rehab. This injury is largely secondary to increased biomechanical stress as sequela of his history of right rib resection described in HPI. We reviewed OTC pain management and a home exercise program for rehabilitation. F/u in 6-8 weeks, sooner if issues arise. Periscapular pain Improved with tailored home exercise program. Encouraged continuation of this.    Glean Salen, MD PGY-4, Sports Medicine Fellow [facility]  I observed and examined the patient with the resident and agree with assessment and plan.  Note reviewed and modified by me. Sterling Big, MD

## 2023-12-13 NOTE — Assessment & Plan Note (Signed)
Improved with tailored home exercise program. Encouraged continuation of this.

## 2023-12-13 NOTE — Patient Instructions (Signed)
On the ultrasound we saw a small partial thickness tear of your infraspinatus tendon which is one of the rotator cuff tendons.  Modify your current shoulder exercises to only go about 80-90% of the full extended motion for the next 2-3 months while this heals.  Continue the scapular stabilization exercises you have been doing, but add the exercises for the rotator cuff that we reviewed today. Start these without weight for the next 1-2 weeks and if you are able to do them without pain, gradually increase the weight (ie 1-2 lbs at a time) over the next 6 weeks. Stay under 10# for these exercises for the next 2-3 months as pain allows.

## 2023-12-14 ENCOUNTER — Other Ambulatory Visit (HOSPITAL_COMMUNITY): Payer: Self-pay

## 2023-12-14 MED ORDER — ZEPBOUND 12.5 MG/0.5ML ~~LOC~~ SOAJ
12.5000 mg | SUBCUTANEOUS | 0 refills | Status: DC
  Filled 2023-12-14 – 2023-12-20 (×2): qty 2, 28d supply, fill #0

## 2023-12-20 ENCOUNTER — Other Ambulatory Visit (HOSPITAL_COMMUNITY): Payer: Self-pay

## 2024-01-03 ENCOUNTER — Other Ambulatory Visit: Payer: Self-pay | Admitting: Psychiatry

## 2024-01-03 DIAGNOSIS — F5105 Insomnia due to other mental disorder: Secondary | ICD-10-CM

## 2024-01-17 ENCOUNTER — Other Ambulatory Visit: Payer: Self-pay

## 2024-01-17 ENCOUNTER — Other Ambulatory Visit (HOSPITAL_COMMUNITY): Payer: Self-pay

## 2024-01-17 MED ORDER — ZEPBOUND 12.5 MG/0.5ML ~~LOC~~ SOAJ
12.5000 mg | SUBCUTANEOUS | 0 refills | Status: DC
  Filled 2024-01-17 (×2): qty 2, 28d supply, fill #0

## 2024-01-18 ENCOUNTER — Other Ambulatory Visit: Payer: Self-pay

## 2024-02-11 ENCOUNTER — Other Ambulatory Visit: Payer: Self-pay

## 2024-02-11 MED ORDER — ZEPBOUND 12.5 MG/0.5ML ~~LOC~~ SOAJ
12.5000 mg | SUBCUTANEOUS | 0 refills | Status: DC
  Filled 2024-02-11 – 2024-02-12 (×2): qty 2, 28d supply, fill #0

## 2024-02-12 ENCOUNTER — Other Ambulatory Visit (HOSPITAL_COMMUNITY): Payer: Self-pay

## 2024-02-12 ENCOUNTER — Other Ambulatory Visit: Payer: Self-pay

## 2024-02-21 ENCOUNTER — Other Ambulatory Visit: Payer: Self-pay

## 2024-02-26 ENCOUNTER — Ambulatory Visit: Payer: No Typology Code available for payment source | Admitting: Psychiatry

## 2024-03-03 ENCOUNTER — Other Ambulatory Visit: Payer: Self-pay

## 2024-03-05 ENCOUNTER — Other Ambulatory Visit: Payer: Self-pay | Admitting: Psychiatry

## 2024-03-05 DIAGNOSIS — F5105 Insomnia due to other mental disorder: Secondary | ICD-10-CM

## 2024-03-06 ENCOUNTER — Other Ambulatory Visit (HOSPITAL_COMMUNITY): Payer: Self-pay

## 2024-03-06 MED ORDER — ZEPBOUND 12.5 MG/0.5ML ~~LOC~~ SOAJ
12.5000 mg | SUBCUTANEOUS | 0 refills | Status: DC
Start: 2024-03-05 — End: 2024-03-28
  Filled 2024-03-06: qty 2, 28d supply, fill #0

## 2024-03-28 ENCOUNTER — Other Ambulatory Visit (HOSPITAL_COMMUNITY): Payer: Self-pay

## 2024-03-28 MED ORDER — ZEPBOUND 12.5 MG/0.5ML ~~LOC~~ SOAJ
12.5000 mg | SUBCUTANEOUS | 0 refills | Status: AC
  Filled 2024-03-28: qty 2, 28d supply, fill #0

## 2024-05-06 ENCOUNTER — Encounter: Payer: Self-pay | Admitting: Psychiatry

## 2024-05-06 ENCOUNTER — Ambulatory Visit: Payer: No Typology Code available for payment source | Admitting: Psychiatry

## 2024-05-06 DIAGNOSIS — F09 Unspecified mental disorder due to known physiological condition: Secondary | ICD-10-CM

## 2024-05-06 DIAGNOSIS — F411 Generalized anxiety disorder: Secondary | ICD-10-CM | POA: Diagnosis not present

## 2024-05-06 DIAGNOSIS — F3342 Major depressive disorder, recurrent, in full remission: Secondary | ICD-10-CM

## 2024-05-06 DIAGNOSIS — F900 Attention-deficit hyperactivity disorder, predominantly inattentive type: Secondary | ICD-10-CM | POA: Diagnosis not present

## 2024-05-06 DIAGNOSIS — G4733 Obstructive sleep apnea (adult) (pediatric): Secondary | ICD-10-CM

## 2024-05-06 DIAGNOSIS — F50812 Binge eating disorder, severe: Secondary | ICD-10-CM

## 2024-05-06 DIAGNOSIS — F5105 Insomnia due to other mental disorder: Secondary | ICD-10-CM

## 2024-05-06 MED ORDER — VILAZODONE HCL 10 MG PO TABS: 10.0000 mg | ORAL_TABLET | Freq: Every day | ORAL | 3 refills | Status: AC

## 2024-05-06 MED ORDER — DAYVIGO 10 MG PO TABS: ORAL_TABLET | ORAL | 5 refills | Status: DC

## 2024-05-06 MED ORDER — LORAZEPAM 0.5 MG PO TABS: 0.5000 mg | ORAL_TABLET | Freq: Three times a day (TID) | ORAL | 4 refills | Status: DC | PRN
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
## 2024-05-14 ENCOUNTER — Other Ambulatory Visit (HOSPITAL_COMMUNITY): Payer: Self-pay

## 2024-05-20 ENCOUNTER — Other Ambulatory Visit (HOSPITAL_COMMUNITY): Payer: Self-pay

## 2024-05-20 ENCOUNTER — Other Ambulatory Visit: Payer: Self-pay

## 2024-05-20 MED ORDER — JATENZO 198 MG PO CAPS: 1.0000 | ORAL_CAPSULE | Freq: Two times a day (BID) | ORAL | 1 refills | Status: DC

## 2024-05-20 MED ORDER — JATENZO 158 MG PO CAPS
1.0000 | ORAL_CAPSULE | Freq: Two times a day (BID) | ORAL | 1 refills | Status: DC
  Filled 2024-05-20: qty 180, 90d supply, fill #0

## 2024-06-12 ENCOUNTER — Other Ambulatory Visit: Payer: Self-pay | Admitting: Psychiatry

## 2024-06-12 DIAGNOSIS — F5105 Insomnia due to other mental disorder: Secondary | ICD-10-CM

## 2024-06-23 ENCOUNTER — Telehealth: Payer: Self-pay | Admitting: Psychiatry

## 2024-06-23 NOTE — Telephone Encounter (Signed)
 Pt brought in letter from [employer]. Stated DAYVIGO  will need PA.  Gave letter to [employer].

## 2024-06-24 ENCOUNTER — Telehealth: Payer: Self-pay

## 2024-06-24 NOTE — Telephone Encounter (Signed)
 Working on a PA for pt

## 2024-06-25 NOTE — Telephone Encounter (Signed)
 Prior approval received effective 06/24/24-06/24/25 for Dayvigo  10 mg with [employer]

## 2024-07-10 ENCOUNTER — Ambulatory Visit (INDEPENDENT_AMBULATORY_CARE_PROVIDER_SITE_OTHER): Admitting: Sports Medicine

## 2024-07-10 VITALS — BP 114/82 | Ht 73.0 in | Wt 215.0 lb

## 2024-07-10 DIAGNOSIS — M545 Low back pain, unspecified: Secondary | ICD-10-CM | POA: Diagnosis not present

## 2024-07-10 DIAGNOSIS — G8929 Other chronic pain: Secondary | ICD-10-CM | POA: Diagnosis not present

## 2024-07-10 MED ORDER — CYCLOBENZAPRINE HCL 10 MG PO TABS: ORAL_TABLET | ORAL | 0 refills | Status: AC

## 2024-07-10 NOTE — Patient Instructions (Addendum)
 You can continue exercising as long as they aren't increasing your pain. Switch any of your standing exercises (curls,etc) to be done seated with back support while you rehab your back.

## 2024-07-10 NOTE — Assessment & Plan Note (Signed)
 I gave him some exercises particularly to focus on stretching and strengthening of the right quadratus lumborum He can continue to do any of his other exercises that do not increase his pain Because heelstrike increased his back pain we put in bilateral heel lifts Walking with these in his current shoes seem to lessen his back pain He wants to try those out for a few weeks but we will make him custom orthotics if that seems to take more pressure off his back We also added Flexeril  10 mg to take at night to break the spasm and can increase to 20 mg if needed Recheck approximately 1 month

## 2024-07-10 NOTE — Progress Notes (Signed)
 Chief complaint right-sided low back pain  Patient has been very active and has been working to strengthen his right side since that time he had a tumor -an osteochondroma of his ribs that required surgical resection of the significant portion of tissue in his right upper chest  He is not sure when or specifically what happened but he feels that it and working his right side his right low back is become tighter He has pain just below his rib cage and above his pelvis primarily on the right No sciatic symptoms No weakness No numbness  Physical dam Muscular white male in no acute distress BP 114/82   Ht 6' 1 (1.854 m)   Wt 215 lb (97.5 kg)   BMI 28.37 kg/m   He has point tenderness to palpation all along his quadratus lumborum on the right side There is no tenderness directly over the lumbar spine He can do toe walk and heel walk Heel compression or walking on his heel does cause pain over the quadratus lumborum Neurologic testing and straight leg raise are negative

## 2024-07-29 ENCOUNTER — Other Ambulatory Visit (HOSPITAL_COMMUNITY): Payer: Self-pay

## 2024-08-04 ENCOUNTER — Other Ambulatory Visit (HOSPITAL_COMMUNITY): Payer: Self-pay

## 2024-08-04 MED ORDER — NATESTO 5.5 MG/ACT NA GEL
1.0000 | Freq: Three times a day (TID) | NASAL | 5 refills | Status: DC
  Filled 2024-08-04: qty 21.96, 30d supply, fill #0
  Filled 2024-09-10: qty 21.96, 30d supply, fill #1

## 2024-08-05 ENCOUNTER — Other Ambulatory Visit: Payer: Self-pay | Admitting: Psychiatry

## 2024-08-05 ENCOUNTER — Other Ambulatory Visit: Payer: Self-pay

## 2024-08-05 ENCOUNTER — Other Ambulatory Visit (HOSPITAL_COMMUNITY): Payer: Self-pay

## 2024-08-05 DIAGNOSIS — F3342 Major depressive disorder, recurrent, in full remission: Secondary | ICD-10-CM

## 2024-08-06 ENCOUNTER — Other Ambulatory Visit: Payer: Self-pay | Admitting: Sports Medicine

## 2024-08-06 ENCOUNTER — Other Ambulatory Visit (HOSPITAL_COMMUNITY): Payer: Self-pay

## 2024-08-12 ENCOUNTER — Ambulatory Visit (INDEPENDENT_AMBULATORY_CARE_PROVIDER_SITE_OTHER): Admitting: Sports Medicine

## 2024-08-12 VITALS — BP 118/84 | Ht 73.0 in | Wt 218.0 lb

## 2024-08-12 DIAGNOSIS — M545 Low back pain, unspecified: Secondary | ICD-10-CM

## 2024-08-12 DIAGNOSIS — M79672 Pain in left foot: Secondary | ICD-10-CM

## 2024-08-12 DIAGNOSIS — M79671 Pain in right foot: Secondary | ICD-10-CM | POA: Diagnosis not present

## 2024-08-12 DIAGNOSIS — G8929 Other chronic pain: Secondary | ICD-10-CM

## 2024-08-12 NOTE — Progress Notes (Addendum)
 PCP: Arloa Elsie SAUNDERS, MD  Subjective:   HPI: Patient is a 58 y.o. male here for orthotic fitting today.  He had heel pads placed at his previous visit and noticed improvement in his symptoms.  He thinks that he may benefit from custom orthotics  No past medical history on file.  Current Outpatient Medications on File Prior to Visit  Medication Sig Dispense Refill   cabergoline (DOSTINEX) 0.5 MG tablet Take 0.5 mg by mouth 2 (two) times a week.     Cholecalciferol 125 MCG (5000 UT) TABS Take by mouth.     cyclobenzaprine  (FLEXERIL ) 10 MG tablet Take 1-2 at bedtime 60 tablet 0   DENTA 5000 PLUS 1.1 % CREA dental cream      L-Methylfolate 15 MG TABS TAKE 1 TABLET BY MOUTH EVERY DAY 90 tablet 3   lansoprazole (PREVACID) 15 MG capsule Take by mouth. 2 daily     Lemborexant  (DAYVIGO ) 10 MG TABS TAKE 1 TABLET BY MOUTH EVERYDAY AT BEDTIME 30 tablet 5   levETIRAcetam (KEPPRA) 750 MG tablet Take 750 mg by mouth 2 (two) times daily.     levothyroxine (SYNTHROID, LEVOTHROID) 75 MCG tablet      lisdexamfetamine (VYVANSE ) 20 MG capsule Take by mouth daily.     loratadine (CLARITIN) 10 MG tablet Take by mouth.     LORazepam  (ATIVAN ) 0.5 MG tablet Take 1 tablet (0.5 mg total) by mouth every 8 (eight) hours as needed for anxiety. 30 tablet 4   Magnesium 500 MG CAPS Take 500 mg by mouth.     methocarbamol  (ROBAXIN ) 500 MG tablet Take 1 tablet (500 mg total) by mouth 4 (four) times daily as needed for muscle spasms. 30 tablet 2   mirabegron ER (MYRBETRIQ) 50 MG TB24 tablet Take 50 mg by mouth daily.     naproxen sodium (ALEVE) 220 MG tablet Take by mouth.     Phosphatidylserine 100 MG CAPS Take 1 capsule by mouth daily.     tadalafil (CIALIS) 10 MG tablet Take 10 mg by mouth at bedtime.     Testosterone  (NATESTO ) 5.5 MG/ACT GEL Place 1 Pump into both nostrils 3 (three) times daily. 21.96 g 5   Testosterone  Undecanoate (JATENZO ) 237 MG CAPS Take 1 capsule by mouth in the morning and at bedtime.      tirzepatide  (ZEPBOUND ) 12.5 MG/0.5ML Pen Inject 12.5 mg into the skin once a week. 2 mL 0   Vilazodone  HCl (VIIBRYD ) 10 MG TABS Take 1 tablet (10 mg total) by mouth daily. 90 tablet 3   vitamin B-12 (CYANOCOBALAMIN ) 500 MCG tablet Take 5,000 mcg by mouth daily.     No current facility-administered medications on file prior to visit.    BP 118/84   Ht 6' 1 (1.854 m)   Wt 218 lb (98.9 kg)   BMI 28.76 kg/m        Objective:   Physical Exam:  Gen: NAD, comfortable in exam room Bilateral longl arch without collapse Cavus foot shape but moderate Splaying of bilateral 4th and 5th toes Widening of forefoot  Assessment/Plan:   MILIANO COTTEN is a 58 y.o. male who was seen today for the following: 1. Pain in both feet (Primary) 2. Chronic right-sided low back pain without sciatica  Patient was fitted for a standard, cushioned, semi-rigid orthotic.  The orthotic was heated and the patient stood on the orthotic blank positioned on the orthotic stand. The patient was positioned in subtalar neutral position and 10 degrees of ankle  dorsiflexion in a weight bearing stance. After molding, a heel base was applied to the orthotic blank.   size: 10 base: Blue [place]: none additional orthotic padding: heel bilaterally  Orthotics fit well and patients gait: Normal gait with good fit and good comfort    Follow-up/Education:   Return if symptoms worsen or fail to improve.   May return sooner as needed and encouraged to call/e-mail for additional questions or  worsening symptoms in the interim.  Krystal Lowing, DO Sports Medicine Fellow 08/12/2024 12:56 PM

## 2024-08-12 NOTE — Assessment & Plan Note (Signed)
 Trial of orthotics to see if they help relieve his LBP Has cavus foot type with bilateral foot pain This makes response more likely

## 2024-08-20 NOTE — Telephone Encounter (Signed)
 No further review noted

## 2024-09-10 ENCOUNTER — Other Ambulatory Visit (HOSPITAL_COMMUNITY): Payer: Self-pay

## 2024-09-10 ENCOUNTER — Other Ambulatory Visit: Payer: Self-pay

## 2024-09-11 ENCOUNTER — Other Ambulatory Visit: Payer: Self-pay

## 2024-09-11 ENCOUNTER — Other Ambulatory Visit (HOSPITAL_COMMUNITY): Payer: Self-pay

## 2024-10-06 ENCOUNTER — Other Ambulatory Visit (HOSPITAL_COMMUNITY): Payer: Self-pay

## 2024-10-06 ENCOUNTER — Encounter (HOSPITAL_COMMUNITY): Payer: Self-pay

## 2024-10-09 ENCOUNTER — Other Ambulatory Visit (HOSPITAL_COMMUNITY): Payer: Self-pay

## 2024-10-09 MED ORDER — NATESTO 5.5 MG/ACT NA GEL
1.0000 | Freq: Three times a day (TID) | NASAL | 5 refills | Status: AC
  Filled 2024-10-09 – 2024-10-10 (×2): qty 21.96, 30d supply, fill #0
  Filled 2024-10-29: qty 21.96, 30d supply, fill #1
  Filled 2024-11-04: qty 21.96, 20d supply, fill #1
  Filled 2024-12-30: qty 21.96, 30d supply, fill #2

## 2024-10-10 ENCOUNTER — Other Ambulatory Visit (HOSPITAL_COMMUNITY): Payer: Self-pay

## 2024-10-10 ENCOUNTER — Other Ambulatory Visit (HOSPITAL_BASED_OUTPATIENT_CLINIC_OR_DEPARTMENT_OTHER): Payer: Self-pay

## 2024-10-29 ENCOUNTER — Other Ambulatory Visit (HOSPITAL_BASED_OUTPATIENT_CLINIC_OR_DEPARTMENT_OTHER): Payer: Self-pay

## 2024-11-04 ENCOUNTER — Other Ambulatory Visit: Payer: Self-pay

## 2024-11-04 ENCOUNTER — Encounter (HOSPITAL_BASED_OUTPATIENT_CLINIC_OR_DEPARTMENT_OTHER): Payer: Self-pay

## 2024-11-04 ENCOUNTER — Other Ambulatory Visit: Payer: Self-pay | Admitting: Psychiatry

## 2024-11-04 ENCOUNTER — Other Ambulatory Visit (HOSPITAL_BASED_OUTPATIENT_CLINIC_OR_DEPARTMENT_OTHER): Payer: Self-pay

## 2024-11-04 DIAGNOSIS — F5105 Insomnia due to other mental disorder: Secondary | ICD-10-CM

## 2024-11-05 ENCOUNTER — Other Ambulatory Visit (HOSPITAL_BASED_OUTPATIENT_CLINIC_OR_DEPARTMENT_OTHER): Payer: Self-pay

## 2024-11-06 ENCOUNTER — Encounter: Payer: Self-pay | Admitting: Psychiatry

## 2024-11-06 ENCOUNTER — Ambulatory Visit: Admitting: Psychiatry

## 2024-11-06 DIAGNOSIS — F50811 Binge eating disorder, moderate: Secondary | ICD-10-CM

## 2024-11-06 DIAGNOSIS — F3342 Major depressive disorder, recurrent, in full remission: Secondary | ICD-10-CM | POA: Diagnosis not present

## 2024-11-06 DIAGNOSIS — F5105 Insomnia due to other mental disorder: Secondary | ICD-10-CM

## 2024-11-06 DIAGNOSIS — F09 Unspecified mental disorder due to known physiological condition: Secondary | ICD-10-CM | POA: Diagnosis not present

## 2024-11-06 DIAGNOSIS — F411 Generalized anxiety disorder: Secondary | ICD-10-CM | POA: Diagnosis not present

## 2024-11-06 DIAGNOSIS — G4733 Obstructive sleep apnea (adult) (pediatric): Secondary | ICD-10-CM

## 2024-11-06 DIAGNOSIS — F900 Attention-deficit hyperactivity disorder, predominantly inattentive type: Secondary | ICD-10-CM | POA: Diagnosis not present

## 2024-11-06 MED ORDER — LORAZEPAM 0.5 MG PO TABS: 0.5000 mg | ORAL_TABLET | Freq: Three times a day (TID) | ORAL | 4 refills | Status: AC | PRN

## 2024-11-06 MED ORDER — DAYVIGO 10 MG PO TABS: ORAL_TABLET | ORAL | 5 refills | Status: AC
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
## 2024-11-17 ENCOUNTER — Other Ambulatory Visit (HOSPITAL_BASED_OUTPATIENT_CLINIC_OR_DEPARTMENT_OTHER): Payer: Self-pay

## 2024-11-18 ENCOUNTER — Other Ambulatory Visit (HOSPITAL_BASED_OUTPATIENT_CLINIC_OR_DEPARTMENT_OTHER): Payer: Self-pay

## 2024-11-24 ENCOUNTER — Other Ambulatory Visit (HOSPITAL_BASED_OUTPATIENT_CLINIC_OR_DEPARTMENT_OTHER): Payer: Self-pay

## 2024-12-08 ENCOUNTER — Other Ambulatory Visit (HOSPITAL_COMMUNITY): Payer: Self-pay

## 2024-12-08 ENCOUNTER — Other Ambulatory Visit (HOSPITAL_BASED_OUTPATIENT_CLINIC_OR_DEPARTMENT_OTHER): Payer: Self-pay

## 2024-12-22 ENCOUNTER — Other Ambulatory Visit (HOSPITAL_COMMUNITY): Payer: Self-pay

## 2024-12-30 ENCOUNTER — Other Ambulatory Visit (HOSPITAL_COMMUNITY): Payer: Self-pay

## 2024-12-30 ENCOUNTER — Other Ambulatory Visit (HOSPITAL_BASED_OUTPATIENT_CLINIC_OR_DEPARTMENT_OTHER): Payer: Self-pay

## 2024-12-31 ENCOUNTER — Other Ambulatory Visit (HOSPITAL_BASED_OUTPATIENT_CLINIC_OR_DEPARTMENT_OTHER): Payer: Self-pay

## 2025-01-01 ENCOUNTER — Other Ambulatory Visit (HOSPITAL_BASED_OUTPATIENT_CLINIC_OR_DEPARTMENT_OTHER): Payer: Self-pay

## 2025-01-01 ENCOUNTER — Other Ambulatory Visit: Payer: Self-pay

## 2025-01-02 ENCOUNTER — Other Ambulatory Visit (HOSPITAL_BASED_OUTPATIENT_CLINIC_OR_DEPARTMENT_OTHER): Payer: Self-pay

## 2025-01-05 ENCOUNTER — Other Ambulatory Visit (HOSPITAL_COMMUNITY): Payer: Self-pay

## 2025-01-05 MED ORDER — NATESTO 5.5 MG/ACT NA GEL: 1.0000 | Freq: Three times a day (TID) | NASAL | 0 refills | Status: AC

## 2025-05-06 ENCOUNTER — Ambulatory Visit: Admitting: Psychiatry
